# Patient Record
Sex: Female | Born: 1985 | Race: White | Hispanic: No | Marital: Married | Smoking: Never smoker
Health system: Southern US, Community
[De-identification: ages and names within clinical notes are randomized; demographics above are authoritative.]

## PROBLEM LIST (undated history)

## (undated) DIAGNOSIS — E282 Polycystic ovarian syndrome: Secondary | ICD-10-CM

## (undated) HISTORY — DX: Polycystic ovarian syndrome: E28.2

---
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
## 2006-08-03 ENCOUNTER — Encounter: Payer: Self-pay | Admitting: Family Medicine

## 2006-08-13 ENCOUNTER — Encounter: Payer: Self-pay | Admitting: Family Medicine

## 2006-08-13 ENCOUNTER — Other Ambulatory Visit: Admission: RE | Admit: 2006-08-13 | Discharge: 2006-08-13 | Payer: Self-pay | Admitting: Family Medicine

## 2006-08-13 ENCOUNTER — Ambulatory Visit: Payer: Self-pay | Admitting: Family Medicine

## 2006-08-13 LAB — CONVERTED CEMR LAB: Pap Smear: NORMAL

## 2006-08-14 ENCOUNTER — Encounter: Payer: Self-pay | Admitting: Family Medicine

## 2006-08-16 ENCOUNTER — Encounter: Payer: Self-pay | Admitting: Family Medicine

## 2006-08-16 LAB — CONVERTED CEMR LAB
ALT: 27 units/L (ref 0–35)
AST: 24 units/L (ref 0–37)
Albumin: 4.4 g/dL (ref 3.5–5.2)
Alkaline Phosphatase: 48 units/L (ref 39–117)
BUN: 10 mg/dL (ref 6–23)
CO2: 26 meq/L (ref 19–32)
Calcium: 9.7 mg/dL (ref 8.4–10.5)
Chloride: 104 meq/L (ref 96–112)
Cholesterol, target level: 200 mg/dL
Cholesterol: 181 mg/dL (ref 0–200)
Creatinine, Ser: 0.76 mg/dL (ref 0.40–1.20)
Glucose, Bld: 85 mg/dL (ref 70–99)
HDL goal, serum: 40 mg/dL
HDL: 75 mg/dL (ref >39)
LDL Cholesterol: 96 mg/dL (ref 0–99)
LDL Goal: 160 mg/dL
Potassium: 4.4 meq/L (ref 3.5–5.3)
Sodium: 141 meq/L (ref 135–145)
Total Bilirubin: 0.9 mg/dL (ref 0.3–1.2)
Total CHOL/HDL Ratio: 2.4
Total Protein: 7.5 g/dL (ref 6.0–8.3)
Triglycerides: 49 mg/dL (ref <150)
VLDL: 10 mg/dL (ref 0–40)

## 2007-09-08 ENCOUNTER — Ambulatory Visit: Payer: Self-pay | Admitting: Family Medicine

## 2007-09-08 ENCOUNTER — Other Ambulatory Visit: Admission: RE | Admit: 2007-09-08 | Discharge: 2007-09-08 | Payer: Self-pay | Admitting: Family Medicine

## 2007-09-08 ENCOUNTER — Encounter: Payer: Self-pay | Admitting: Family Medicine

## 2007-09-08 ENCOUNTER — Telehealth: Payer: Self-pay | Admitting: Family Medicine

## 2007-09-08 DIAGNOSIS — R635 Abnormal weight gain: Secondary | ICD-10-CM | POA: Insufficient documentation

## 2007-09-08 DIAGNOSIS — L708 Other acne: Secondary | ICD-10-CM | POA: Insufficient documentation

## 2007-09-13 LAB — CONVERTED CEMR LAB
ALT: 25 units/L (ref 0–35)
AST: 18 units/L (ref 0–37)
Albumin: 4.6 g/dL (ref 3.5–5.2)
Alkaline Phosphatase: 55 units/L (ref 39–117)
BUN: 12 mg/dL (ref 6–23)
CO2: 26 meq/L (ref 19–32)
Calcium: 9.9 mg/dL (ref 8.4–10.5)
Chloride: 103 meq/L (ref 96–112)
Creatinine, Ser: 0.81 mg/dL (ref 0.40–1.20)
FSH: 5.1 milliintl units/mL
Glucose, Bld: 100 mg/dL — ABNORMAL HIGH (ref 70–99)
LH: 15.4 milliintl units/mL
Pap Smear: NORMAL
Potassium: 4 meq/L (ref 3.5–5.3)
Sodium: 140 meq/L (ref 135–145)
TSH: 2.61 microintl units/mL (ref 0.350–4.50)
Testosterone: 58.83 ng/dL (ref 10–70)
Total Bilirubin: 0.4 mg/dL (ref 0.3–1.2)
Total Protein: 7.5 g/dL (ref 6.0–8.3)

## 2007-09-16 ENCOUNTER — Telehealth: Payer: Self-pay | Admitting: Family Medicine

## 2007-09-20 ENCOUNTER — Telehealth: Payer: Self-pay | Admitting: Family Medicine

## 2007-09-22 ENCOUNTER — Encounter: Payer: Self-pay | Admitting: Family Medicine

## 2007-10-04 LAB — CONVERTED CEMR LAB
DHEA-SO4: 233 ug/dL (ref 35–430)
Glucose, Bld: 100 mg/dL — ABNORMAL HIGH (ref 70–99)
Prolactin: 13.8 ng/mL
Sex Hormone Binding: 87 nmol/L (ref 18–114)
Testosterone Free: 4 pg/mL (ref 0.6–6.8)
Testosterone-% Free: 0.9 % (ref 0.4–2.4)
Testosterone: 44.08 ng/dL (ref 10–70)

## 2007-10-28 ENCOUNTER — Telehealth: Payer: Self-pay | Admitting: Family Medicine

## 2007-11-29 ENCOUNTER — Telehealth: Payer: Self-pay | Admitting: Family Medicine

## 2008-04-13 ENCOUNTER — Ambulatory Visit: Payer: Self-pay | Admitting: Family Medicine

## 2008-09-25 ENCOUNTER — Ambulatory Visit: Payer: Self-pay | Admitting: Family Medicine

## 2008-10-24 ENCOUNTER — Ambulatory Visit: Payer: Self-pay | Admitting: Family Medicine

## 2009-08-19 ENCOUNTER — Encounter: Payer: Self-pay | Admitting: Family Medicine

## 2009-08-19 LAB — CONVERTED CEMR LAB: Pap Smear: NORMAL

## 2009-08-21 ENCOUNTER — Ambulatory Visit: Payer: Self-pay | Admitting: Obstetrics & Gynecology

## 2009-09-16 ENCOUNTER — Ambulatory Visit: Payer: Self-pay | Admitting: Family Medicine

## 2009-09-16 LAB — CONVERTED CEMR LAB
Bilirubin Urine: NEGATIVE
Glucose, Urine, Semiquant: NEGATIVE
Hemoglobin: 15 g/dL
Ketones, urine, test strip: NEGATIVE
Nitrite: NEGATIVE
Protein, U semiquant: NEGATIVE
Specific Gravity, Urine: 1.01
Urobilinogen, UA: 0.2
WBC Urine, dipstick: NEGATIVE
pH: 6.5

## 2010-02-18 NOTE — Miscellaneous (Signed)
Summary: Flu Shot/[facility]  Flu Shot/[facility]   Imported By: Lanelle Bal 11/08/2008 08:49:36  _____________________________________________________________________  External Attachment:    Type:   Image     Comment:   External Document

## 2010-02-18 NOTE — Letter (Signed)
Summary: Lipid Letter  [facility]  [address]   [place], [place] [address]   Phone: [phone]  Fax: [phone]    08/16/2006  Erin Stone [address] [place], [place]  [address]  Dear Erin Stone:  We have carefully reviewed your last lipid profile from 08/14/2006 and the results are noted below with a summary of recommendations for lipid management.    Cholesterol:       181     Goal: <200   HDL "good" Cholesterol:   75     Goal: >40   LDL "bad" Cholesterol:     96     Goal: <160   Triglycerides:       49     Goal: <150    Your lipid goals have been met.  Continue with your current TLC diet and regimen (see below). Your liver, kidneys, and blood salts look great!    TLC Diet (Therapeutic Lifestyle Change): Total Fat should be no greater than 25-35% of total calories Carbohydrates should be 50-60% of total calories Protein should be approximately 15% of total calories Fiber should be at least 20-30 grams a day ***Increased fiber may help lower LDL Total Cholesterol should be < 200mg /day Consider adding plant stanol/sterols to diet (example: Benacol spread) ***A higher intake of unsaturated fat may reduce Triglycerides and Increase HDL    Adjunctive Measures (may lower LIPIDS and reduce risk of Heart Attack) include: Aerobic Exercise (20-30 minutes 3-4 times a week) Limit Alcohol Consumption Dietary Fiber 20-30 grams a day by mouth     Current Medications:  None If you have any questions, please call. We appreciate being able to work with you.   Sincerely,    [facility] Nani Gasser MD

## 2010-02-18 NOTE — Progress Notes (Signed)
Summary: Change Acne med  Phone Note Call from Patient Call back at Home Phone [phone]   Caller: Patient Call For: Nani Gasser MD Summary of Call: Pt calls and states that [facility] and was wondering if there was anything cheaper you could [facility] [address]. KJ LPN Initial call taken by: Kathlene November,  September 08, 2007 4:30 PM    New/Updated Medications: CLINDAMYCIN PHOSPHATE 1 %  LOTN (CLINDAMYCIN PHOSPHATE) Apply two times a day to affected area. Generic please   Prescriptions: CLINDAMYCIN PHOSPHATE 1 %  LOTN (CLINDAMYCIN PHOSPHATE) Apply two times a day to affected area. Generic please  #74ml x 3   Entered and Authorized by:   Nani Gasser MD   Signed by:   Nani Gasser MD on 09/08/2007   Method used:   [facility] ...       [facility]  [id number]       [address] [place], [place]         Ph: [phone] or [phone]       Fax: [phone]   RxID:   [id number]

## 2010-02-18 NOTE — Progress Notes (Signed)
Summary: Pt request cell number to be called for results.  Phone Note Call from Patient Call back at [phone] cell   Caller: Patient Call For: FYI Details for Reason: Number to call for results Summary of Call: Pt would like to be called at the cell number listed above for any tests or alb results. KJ LPN Initial call taken by: Kathlene November,  September 16, 2007 12:07 PM

## 2010-02-18 NOTE — Assessment & Plan Note (Signed)
Summary: SORE THROAT & COUGH/KH   Vital Signs:  Patient Profile:   25 Years Old Female CC:      head congestion, throat and ear discomfort Height:     64.5 inches (163.83 cm) Weight:      169 pounds O2 Sat:      98 % O2 treatment:    Room Air Temp:     97.6 degrees F oral Pulse rate:   81 / minute Resp:     20 per minute BP sitting:   133 / 88  (left arm) Cuff size:   regular  Pt. in pain?   no                   Current Allergies: No known allergies History of Present Illness Chief Complaint: head congestion, throat and ear discomfort History of Present Illness: Subjective: Patient complains of URI symptoms that started 3 days ago. + sore throat + cough No pleuritic pain No wheezing + nasal congestion +  post-nasal drainage + sinus pain/pressure No itchy/red eyes No earache No hemoptysis No SOB No fever/chills No nausea No vomiting No abdominal pain No diarrhea No skin rashes + fatigue No myalgias No headache Used OTC meds without relief   REVIEW OF SYSTEMS Constitutional Symptoms      Denies fever, chills, night sweats, weight loss, weight gain, and fatigue.  Eyes       Denies change in vision, eye pain, eye discharge, glasses, contact lenses, and eye surgery. Ear/Nose/Throat/Mouth       Complains of ear pain, frequent nose bleeds, and sore throat.      Denies hearing loss/aids, change in hearing, ear discharge, dizziness, frequent runny nose, sinus problems, hoarseness, and tooth pain or bleeding.  Respiratory       Complains of dry cough.      Denies productive cough, wheezing, shortness of breath, asthma, bronchitis, and emphysema/COPD.  Cardiovascular       Denies murmurs, chest pain, and tires easily with exhertion.    Gastrointestinal       Denies stomach pain, nausea/vomiting, diarrhea, constipation, blood in bowel movements, and indigestion. Genitourniary       Denies painful urination, kidney stones, and loss of urinary  control. Neurological       Complains of headaches.      Denies paralysis, seizures, and fainting/blackouts. Musculoskeletal       Denies muscle pain, joint pain, joint stiffness, decreased range of motion, redness, swelling, muscle weakness, and gout.  Skin       Denies bruising, unusual mles/lumps or sores, and hair/skin or nail changes.  Psych       Denies mood changes, temper/anger issues, anxiety/stress, speech problems, depression, and sleep problems.  Past History:  Past Medical History:  Unremarkable    Objective:  Appearance:  Patient appears healthy, stated age, and in no acute distress  Eyes:  Pupils are equal, round, and reactive to light and accomdation.  Extraocular movement is intact.  Conjunctivae are not inflamed.  Ears:  Canals normal.   Right tympanic membrane normal.  Left tympanic membrane has clear effusion Nose:  Normal septum.  Normal turbinates, mildly congested.   Maxillary sinus tenderness present.  Pharynx:  Normal  Neck:  Supple.  No adenopathy is present.  No thyromegaly is present  Lungs:  Clear to auscultation.  Breath sounds are equal.  Heart:  Regular rate and rhythm without murmurs, rubs, or gallops.  Abdomen:  Nontender without masses or hepatosplenomegaly.  Bowel sounds are present.  No CVA or flank tenderness.  Skin:  no rash Assessment New Problems: OTITIS MEDIA, SEROUS, ACUTE, LEFT (ICD-381.01) ACUTE MAXILLARY SINUSITIS (ICD-461.0) URI (ICD-465.9)   Plan New Medications/Changes: TUSSICAPS 10-8 MG XR12H-CAP (HYDROCOD POLST-CHLORPHEN POLST) One by mouth hs as needed cough  #10 (ten) x 0, 09/25/2008, Donna Christen MD AMOXICILLIN 875 MG TABS (AMOXICILLIN) One by mouth two times a day  #20 x 0, 09/25/2008, Donna Christen MD  New Orders: New Patient Level III [phone] Planning Comments:   Begin amoxicillin, expectorant/decongestant daytime, cough suppressant at night Follow-up with PCP if not improving   The patient and/or caregiver has  been counseled thoroughly with regard to medications prescribed including dosage, schedule, interactions, rationale for use, and possible side effects and they verbalize understanding.  Diagnoses and expected course of recovery discussed and will return if not improved as expected or if the condition worsens. Patient and/or caregiver verbalized understanding.  Prescriptions: TUSSICAPS 10-8 MG XR12H-CAP (HYDROCOD POLST-CHLORPHEN POLST) One by mouth hs as needed cough  #10 (ten) x 0   Entered and Authorized by:   Donna Christen MD   Signed by:   Donna Christen MD on 09/25/2008   Method used:   Print then Give to Patient   RxID:   [id number] AMOXICILLIN 875 MG TABS (AMOXICILLIN) One by mouth two times a day  #20 x 0   Entered and Authorized by:   Donna Christen MD   Signed by:   Donna Christen MD on 09/25/2008   Method used:   Print then Give to Patient   RxID:   [id number]   Patient Instructions: 1)  May use Mucinex D (guaifenesin with decongestant) twice daily for congestion. 2)  Increase fluid intake, rest. 3)  Ibuprofen 200mg , 4 tabs every 8 hours with food for sore throat 4)  May use Afrin nasal spray (or generic oxymetazoline) twice daily for about 5 days.  Also recommend using saline nasal spray several times daily and/or saline nasal irrigation. 5)  Followup with family doctor if not improving one week.

## 2010-02-18 NOTE — Assessment & Plan Note (Signed)
Summary: CPE,PAP   Vital Signs:  Patient Profile:   25 Years Old Female LMP:     07/19/2006 Height:     64.5 inches (163.83 cm) Weight:      136 pounds Pulse rate:   79 / minute BP sitting:   129 / 78  (left arm) Cuff size:   regular  Vitals Entered By: Kathlene November (August 13, 2006 9:32 AM)  Menstrual History: LMP (date): 07/19/2006               PCP:  Cipriano Bunker  Chief Complaint:  CPE with pap.  History of Present Illness: Wants to start HPV vaccines.   Current Allergies: No known allergies   Past Surgical History:    Reviewed history from 10/27/2005 and no changes required:       ACL sx, left knee   Family History:    MI-GF  Social History:    Pension scheme manager at [employer].  Plays soccer. Does Cardio and weights for exercise.  1 sodas daily, no tob, no drugs, occ EtOH.  In fall will live in a dorm.     Risk Factors:  Tobacco use:  never Drug use:  no Caffeine use:  1 drinks per day Alcohol use:  yes Exercise:  yes    Times per week:  1  Family History Risk Factors:    Family History of MI in females < 56 years old:  no    Family History of MI in males < 1 years old:  no   Review of Systems  The patient denies fever, weight loss, vision loss, decreased hearing, chest pain, syncope, abdominal pain, muscle weakness, suspicious skin lesions, abnormal bleeding, and breast masses.     Physical Exam  General:     Well-developed,well-nourished,in no acute distress; alert,appropriate and cooperative throughout examination Head:     Normocephalic and atraumatic without obvious abnormalities. No apparent alopecia or balding. Eyes:     No corneal or conjunctival inflammation noted. EOMI. Perrla.  Ears:     External ear exam shows no significant lesions or deformities.  Otoscopic examination reveals clear canals, tympanic membranes are intact bilaterally without bulging, retraction, inflammation or discharge. Hearing is grossly normal bilaterally. Nose:  External nasal examination shows no deformity or inflammation.  Mouth:     Oral mucosa and oropharynx without lesions or exudates.  Teeth in good repair. Neck:     No deformities, masses, or tenderness noted. Lungs:     Normal respiratory effort, chest expands symmetrically. Lungs are clear to auscultation, no crackles or wheezes. Heart:     Normal rate and regular rhythm. S1 and S2 normal without gallop, murmur, click, rub or other extra sounds. Abdomen:     Bowel sounds positive,abdomen soft and non-tender without masses, organomegaly or hernias noted. Genitalia:     Normal introitus for age, no external lesions, no vaginal discharge, mucosa pink and moist, no vaginal or cervical lesions, no vaginal atrophy, no friaility or hemorrhage, normal uterus size and position, no adnexal masses or tenderness Msk:     normal ROM.   Pulses:     R and L carotid,radial,dorsalis pedis and posterior tibial pulses are full and equal bilaterally Extremities:     No LE edema.  Neurologic:     No cranial nerve deficits noted. Station and gait are normal. . Sensory, motor and coordinative functions appear intact. Skin:     no rashes.   Cervical Nodes:     No lymphadenopathy noted Psych:  Cognition and judgment appear intact. Alert and cooperative with normal attention span and concentration. No apparent delusions, illusions, hallucinations    Impression & Recommendations:  Problem # 1:  Gynecological examination-routine (ICD-V72.31) [place] calcium + Vitamin D.  Encourage regular exercise Gardasil started today. Screening labs.  Menses are regular.  Pt not interested in birth control at this time.   Other Orders: T-Comprehensive Metabolic Panel [id number]) T-Lipid Profile [phone]) HPV Vaccine - 3 sched doses - IM ([id number]) Admin 1st Vaccine (28413)   Patient Instructions: 1)  Take calcium +Vitamin D daily. 2)  It is important that you exercise regularly at least 20 minutes 5  times a week. If you develop chest pain, have severe difficulty breathing, or feel very tired , stop exercising immediately and seek medical attention.         HPV # 1    Vaccine Type: Gardasil    Site: right deltoid    Mfr: Merck    Dose: 0.5 ml    Route: IM    Given by: Kathlene November    Exp. Date: 11/09/2008    Lot #: [id number]    VIS given: 02/20/05 version given August 13, 2006.

## 2010-02-18 NOTE — Assessment & Plan Note (Signed)
Summary: CPE,PAP   Vital Signs:  Patient Profile:   25 Years Old Female LMP:     08/20/2007 Height:     64.5 inches (163.83 cm) Weight:      167 pounds Pulse rate:   80 / minute BP sitting:   133 / 79  (left arm) Cuff size:   regular  Vitals Entered By: Kathlene November (September 08, 2007 3:36 PM)  Menstrual History: LMP (date): 08/20/2007                 PCP:  Cipriano Bunker  Chief Complaint:  CPE with pap.  History of Present Illness: her weight has increased.  Still working out 2-3 x a week.  No change in eating patterns. Tried Alli and still no weight loss. No meds. No family hx of thyroid.  More acne ? not sure. Has been very stressed with work and school. Would like to start birth control to try to help with her acne. Has tried OTC medication with no relief.     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 Does Cardio and weights for exercise.  1 sodas daily, no tob, no drugs, occ EtOH.  In fall will live in a dorm.     Risk Factors:  HIV high-risk behavior:  no    Drinks per day:  0 Exercise:  yes    Type:  weights, cardio   Review of Systems  The patient denies anorexia, fever, weight loss, weight gain, vision loss, decreased hearing, hoarseness, chest pain, syncope, dyspnea on exertion, peripheral edema, prolonged cough, headaches, hemoptysis, abdominal pain, melena, hematochezia, severe indigestion/heartburn, hematuria, incontinence, genital sores, muscle weakness, suspicious skin lesions, transient blindness, difficulty walking, depression, unusual weight change, abnormal bleeding, enlarged lymph nodes, and breast masses.     Physical Exam  General:     Well-developed,well-nourished,in no acute distress; alert,appropriate and cooperative throughout examination Head:     Normocephalic and atraumatic without  obvious abnormalities. No apparent alopecia or balding. Eyes:     No corneal or conjunctival inflammation noted. EOMI. Perrla.  Ears:     External ear exam shows no significant lesions or deformities.  Otoscopic examination reveals clear canals, tympanic membranes are intact bilaterally without bulging, retraction, inflammation or discharge. Hearing is grossly normal bilaterally. Nose:     External nasal examination shows no deformity or inflammation.  Mouth:     Oral mucosa and oropharynx without lesions or exudates.  Teeth in good repair. Neck:     No deformities, masses, or tenderness noted. Chest Wall:     No deformities, masses, or tenderness noted. Breasts:     No mass, nodules, thickening, tenderness, bulging, retraction, inflamation, nipple discharge or skin changes noted.   Lungs:     Normal respiratory effort, chest expands symmetrically. Lungs are clear to auscultation, no crackles or wheezes. Heart:     Normal rate and regular rhythm. S1 and S2 normal without gallop, murmur, click, rub or other extra sounds. Abdomen:     Bowel sounds positive,abdomen soft and non-tender without masses, organomegaly or hernias noted. Rectal:     no external abnormalities.   Genitalia:     Normal introitus for age, no external lesions, no vaginal discharge, mucosa pink and moist, no vaginal or cervical lesions, no vaginal atrophy, no  friaility or hemorrhage, normal uterus size and position, no adnexal masses or tenderness Msk:     No deformity or scoliosis noted of thoracic or lumbar spine.   Pulses:     R and L carotid,radial,l,dorsalis pedis and posterior tibial pulses are full and equal bilaterally Extremities:     No clubbing, cyanosis, edema, or deformity noted with normal full range of motion of all joints.   Neurologic:     No cranial nerve deficits noted. Station and gait are normal.t. Sensory, motor and coordinative functions appear intact. Skin:     Acne mostly on the sides of  her face and neck. Non pustular. Some scabbing from her picking.  Cervical Nodes:     No lymphadenopathy noted Psych:     Cognition and judgment appear intact. Alert and cooperative with normal attention span and concentration. No apparent delusions, illusions, hallucinations    Impression & Recommendations:  Problem # 1:  GYNECOLOGICAL EXAMINATION, ROUTINE (ICD-V72.31) Exam is normal.  Gave samples of [name] to try.   Orders: T-Comprehensive Metabolic Panel [id number]) T-TSH [id number])   Problem # 2:  ACNE VULGARIS (ICD-706.1) Trial of differin for 3 months. If not helping FU.  Orders: T-FSH ([id number]) T-LH [id number]) T-Testosterone; Total [id number])  Her updated medication list for this problem includes:    Differin 0.1 % Crea (Adapalene) .Marland Kitchen... Apply at bedtime and wash off inthe morning. pea sized amount. apply gently and wash hands afterwards Discussed care of the skin and different treatment options.   Problem # 3:  WEIGHT GAIN, ABNORMAL (ICD-783.1) Consider PCOS vs thyroid d/o. Her weight gain has been very abnormal.  Orders: T-Comprehensive Metabolic Panel [id number]) T-TSH [id number]) T-FSH [id number]) T-LH [id number]) T-Testosterone; Total [id number])   Complete Medication List: 1)  [name] 3-0.02 Mg Tabs (Drospirenone-ethinyl estradiol) .... Take 1 tablet by mouth once a day 2)  Differin 0.1 % Crea (Adapalene) .... Apply at bedtime and wash off inthe morning. pea sized amount. apply gently and wash hands afterwards   Patient Instructions: 1)  Take calcium +Vitamin D daily.   Prescriptions: DIFFERIN 0.1 %  CREA (ADAPALENE) Apply at bedtime and wash off inthe morning. Pea sized amount. Apply gently and wash hands afterwards  #1 tube x 3   Entered and Authorized by:   Nani Gasser MD   Signed by:   Nani Gasser MD on 09/08/2007   Method used:   [facility]       [facility]  [address]       [address] [place], [place]  [address]       Ph: [phone] or [phone]       Fax: [phone]   RxID:   [id number]  ]

## 2010-02-18 NOTE — Progress Notes (Signed)
Summary: [name] too ezxpensive  Phone Note Call from Patient Call back at [phone]   Caller: Patient Call For: Erin Stone Summary of Call: Pt can not afford the BCP it was 60.00. Is there anything else you can call in for her that is generic Initial call taken by: Kathlene November,  November 29, 2007 8:16 AM  Follow-up for Phone Call        Sent a generic. Try for a couple of months if not happy then call us.  Follow-up by: Erin Stone,  November 29, 2007 8:22 AM  Additional Follow-up for Phone Call Additional follow up Details #1::        Pt notified that rx sent to pharmacy Additional Follow-up by: Kathlene November,  November 29, 2007 8:29 AM    New/Updated Medications: NORGESTREL-ETHINYL ESTRADIOL 0.3-30 MG-MCG TABS (NORGESTREL-ETHINYL ESTRADIOL) Take 1 tablet by mouth once a day   Prescriptions: NORGESTREL-ETHINYL ESTRADIOL 0.3-30 MG-MCG TABS (NORGESTREL-ETHINYL ESTRADIOL) Take 1 tablet by mouth once a day  #1 pack x 11   Entered and Authorized by:   Erin Stone   Signed by:   Erin Stone on 11/29/2007   Method used:   [facility]ronically to        [facility] [phone]* (retail)       [address] [place], [place]  [address]       Ph: [phone] or [phone]       Fax: [phone]   RxID:   [id number]

## 2010-02-18 NOTE — Progress Notes (Signed)
Summary: [place]  Phone Note Call from Patient Call back at Home Phone [phone] Call back at [phone]   Caller: Patient Call For: Nani Gasser MD Summary of Call: Given samples of [name] and [facility] [address] [facility] [place] Initial call taken by: Kathlene November,  October 28, 2007 8:01 AM      Prescriptions: [name] 3-0.02 MG  TABS (DROSPIRENONE-ETHINYL ESTRADIOL) Take 1 tablet by mouth once a day  #1 x 11   Entered and Authorized by:   Nani Gasser MD   Signed by:   Nani Gasser MD on 10/28/2007   Method used:   [facility] to        [facility] [phone]* (retail)       [address] [place], [place]  [address]       Ph: [phone] or [phone]       Fax: [phone]   RxID:   [id number]

## 2010-02-18 NOTE — Letter (Signed)
Summary: Handout Printed  Printed Handout:  - Rheumatic Fever 

## 2010-02-18 NOTE — Assessment & Plan Note (Signed)
Summary: CPE, NO PAP   Vital Signs:  Patient profile:   25 year old female Height:      64.5 inches Weight:      169 pounds BMI:     28.66 O2 Sat:      96 % on Room air Temp:     97.2 degrees F oral Pulse rate:   66 / minute BP sitting:   138 / 77  (left arm) Cuff size:   regular  Vitals Entered By: Payton Spark CMA (October 24, 2008 1:38 PM)  O2 Flow:  Room air CC: CPE w/out pap   Primary Care Provider:  Linford Arnold, C  CC:  CPE w/out pap.  History of Present Illness: Is sexually active. Has regular periods. Really like her OCPs but has a hard time remembering to take them.   Current Medications (verified): 1)  Low-Ogestrel 0.3-30 Mg-Mcg Tabs (Norgestrel-Ethinyl Estradiol) .... Once A Day  Allergies (verified): No Known Drug Allergies  Comments:  Nurse/Medical Assistant: Payton Spark CMA (October 24, 2008 1:38 PM)  Past History:  Past Medical History: Last updated: 09/25/2008  Unremarkable  Past Surgical History: Last updated: 10/27/2005 ACL sx, left knee  Family History: Reviewed history from 08/13/2006 and no changes required. 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   Does Cardio and weights for exercise.  1 sodas daily, no tob, no drugs, occ EtOH.  In fall will live in a dorm.    Review of Systems  The patient denies anorexia, fever, weight loss, weight gain, vision loss, decreased hearing, hoarseness, chest pain, syncope, dyspnea on exertion, peripheral edema, prolonged cough, headaches, hemoptysis, abdominal pain, melena, hematochezia, severe indigestion/heartburn, hematuria, incontinence, genital sores, muscle weakness, suspicious skin lesions, transient blindness, difficulty walking, depression, unusual weight change, abnormal bleeding, enlarged lymph nodes, and breast masses.    Physical Exam  General:  Well-developed,well-nourished,in no acute distress; alert,appropriate and cooperative throughout examination Head:  Normocephalic and  atraumatic without obvious abnormalities. No apparent alopecia or balding. Eyes:  No corneal or conjunctival inflammation noted. EOMI. Perrla.  Ears:  External ear exam shows no significant lesions or deformities.  Otoscopic examination reveals clear canals, tympanic membranes are intact bilaterally without bulging, retraction, inflammation or discharge. Hearing is grossly normal bilaterally. Nose:  External nasal examination shows no deformity or inflammation. Mouth:  Oral mucosa and oropharynx without lesions or exudates.  Teeth in good repair. Neck:  No deformities, masses, or tenderness noted. Chest Wall:  No deformities, masses, or tenderness noted. Breasts:  No mass, nodules, thickening, tenderness, bulging, retraction, inflamation, nipple discharge or skin changes noted.   Lungs:  Normal respiratory effort, chest expands symmetrically. Lungs are clear to auscultation, no crackles or wheezes. Heart:  Normal rate and regular rhythm. S1 and S2 normal without gallop, murmur, click, rub or other extra sounds. Abdomen:  Bowel sounds positive,abdomen soft and non-tender without masses, organomegaly or hernias noted. Msk:  No deformity or scoliosis noted of thoracic or lumbar spine.   Pulses:  R and L carotid,radial,dorsalis pedis and posterior tibial pulses are full and equal bilaterally Neurologic:  No cranial nerve deficits noted. Station and gait are normal.DTRs are symmetrical throughout. Sensory, motor and coordinative functions appear intact. Skin:  no rashes.   Cervical Nodes:  No lymphadenopathy noted Psych:  Cognition and judgment appear intact. Alert and cooperative with normal attention span and concentration. No apparent delusions, illusions, hallucinations   Impression & Recommendations:  Problem # 1:  HEALTH MAINTENANCE EXAM (ICD-V70.0) Not sure  when last Tdap but says she will check.   Due for Gc/chalm since sexually active and under 25. Pap normal last year so recheck next  year. Due for lipid screen and recheck glucose.  Encourage 4 serv of dairy a day or a calcium supplement.   Recommend increaser her acitivity and exercise level. Se ahs maintained her weight. Discussed birth control options. Pt would like to try the patch. Rx sent to pharm. Call if any problems  Orders: T-Lipid Profile [phone]) T-Glucose, Blood [phone]) T-Chlamydia & GC Probe, Urine ([phone])  Complete Medication List: 1)  Low-ogestrel 0.3-30 Mg-mcg Tabs (Norgestrel-ethinyl estradiol) .... Once a day 2)  Ortho Evra 150-20 Mcg/24hr Ptwk (Norelgestromin-eth estradiol) .... Change once a week for 3 weeks. 4th week no patch. 3)  Metformin Hcl 500 Mg Tabs (Metformin hcl) .... Take 1 tablet by mouth once a day in the am  Other Orders: Admin 1st Vaccine (29562) Flu Vaccine 67yrs + (13086) Flu Vaccine Consent Questions     Do you have a history of severe allergic reactions to this vaccine? no    Any prior history of allergic reactions to egg and/or gelatin? no    Do you have a sensitivity to the preservative Thimersol? no    Do you have a past history of Guillan-Barre Syndrome? no    Do you currently have an acute febrile illness? no    Have you ever had a severe reaction to latex? no    Vaccine information given and explained to patient? yes    Are you currently pregnant? no    Lot Number:[id number]   Exp Date:07/18/2009   Site Given  Left Deltoid IMctions-CCC] Prescriptions: METFORMIN HCL 500 MG TABS (METFORMIN HCL) Take 1 tablet by mouth once a day in the AM  #90 x 3   Entered and Authorized by:   Nani Gasser MD   Signed by:   Nani Gasser MD on 10/24/2008   Method used:   [facility]ronically to        [facility] [phone]* (retail)       [address] [place], [place]  [address]       Ph: [phone] or [phone]       Fax: [phone]   RxID:   [id number] ORTHO EVRA 150-20 MCG/24HR PTWK (NORELGESTROMIN-ETH ESTRADIOL) Change once a week for 3  weeks. 4th week no patch.  #1 box x 11   Entered and Authorized by:   Nani Gasser MD   Signed by:   Nani Gasser MD on 10/24/2008   Method used:   [facility] to        [facility] [phone]* (retail)       [address] [place], [place]  [address]       Ph: [phone] or [phone]       Fax: [phone]   RxID:   [id number]     .lbflu

## 2010-02-18 NOTE — Progress Notes (Signed)
  Phone Note Call from Patient   Caller: Patient Call For: Nani Gasser MD Summary of Call: Pt needs order for the additional lab work you want done on her. Will come tomorrow. I will fax if you will print. Initial call taken by: Kathlene November,  September 20, 2007 3:31 PM  New Problems: BLOOD CHEMISTRY, ABNORMAL (ICD-790.99)   New Problems: BLOOD CHEMISTRY, ABNORMAL (ICD-790.99)

## 2010-02-18 NOTE — Assessment & Plan Note (Signed)
Summary: TB SKIN TEST  Nurse Visit   Vital Signs:  Patient profile:   25 year old female Height:      64.5 inches Weight:      167 pounds BMI:     28.32  Vitals Entered By: Harlene Salts (April 13, 2008 3:36 PM)   History of Present Illness: TB SKIN TEST PLACEMENT    Allergies: No Known Drug Allergies    Immunizations Administered:  PPD Skin Test:    Vaccine Type: PPD    Site: left forearm    Mfr: Sanofi Pasteur    Dose: 0.1 ml    Route: ID    Given by: Harlene Salts    Exp. Date: 05/07/2008    Lot #: [id number]   Orders Added: 1)  TB Skin Test Maurine.Goes    ]  Appended Document: TB SKIN TEST    Nurse Visit     Allergies: No Known Drug Allergies    PPD Results    Date of reading: 04/16/2008    Results: < 5mm    Interpretation: negative     ]

## 2010-02-18 NOTE — Letter (Signed)
Summary: Physical Exam & Immunization Forms  Physical Exam & Immunization Forms   Imported By: Lanelle Bal 09/27/2009 12:25:12  _____________________________________________________________________  External Attachment:    Type:   Image     Comment:   External Document

## 2010-02-18 NOTE — Assessment & Plan Note (Signed)
Summary: Nurse visit to complete school form.   Nurse Visit   Vital Signs:  Patient profile:   25 year old female Height:      64 inches Weight:      167 pounds BMI:     28.77 Temp:     98.4 degrees F oral Pulse rate:   63 / minute Resp:     22 per minute BP sitting:   137 / 85  (left arm) Cuff size:   regular  Vitals Entered By: Kathlene November (September 16, 2009 11:36 AM)  CC:  nurse visit- school form.  CC: nurse visit- school form  Vision Screening:Left eye w/o correction: 20 / 20 Right Eye w/o correction: 20 / 20 Both eyes w/o correction:  20/ 20  Color vision testing: normal      Vision Entered By: Kathlene November (September 16, 2009 11:39 AM) 40db HL: Left  500 hz: 40db 1000 hz: 40db 2000 hz: 40db 4000 hz: 40db Right  500 hz: 40db 1000 hz: 40db 2000 hz: 40db 4000 hz: 40db    Allergies: No Known Drug Allergies Laboratory Results   Urine Tests  Date/Time Received: 09/16/2009 Date/Time Reported: 09/16/2009  Routine Urinalysis   Color: yellow Appearance: Clear Glucose: negative   (Normal Range: Negative) Bilirubin: negative   (Normal Range: Negative) Ketone: negative   (Normal Range: Negative) Spec. Gravity: 1.010   (Normal Range: 1.003-1.035) Blood: trace-lysed   (Normal Range: Negative) pH: 6.5   (Normal Range: 5.0-8.0) Protein: negative   (Normal Range: Negative) Urobilinogen: 0.2   (Normal Range: 0-1) Nitrite: negative   (Normal Range: Negative) Leukocyte Esterace: negative   (Normal Range: Negative)     Blood Tests   Date/Time Received: 09/16/2009 Date/Time Reported: 09/16/2009    CBC   HGB:  15.0 g/dL   (Normal Range: 45.4-09.8 in Males, 12.0-15.0 in Females)    Immunizations Administered:  PPD Skin Test:    Vaccine Type: PPD    Site: left forearm    Mfr: Sanofi Pasteur    Dose: 0.1 ml    Route: ID    Given by: Kathlene November    Exp. Date: 11/20/2010    Lot #: [id number]  Orders Added: 1)  Fingerstick [36416] 2)  Hgb [85018] 3)  UA  Dipstick w/o Micro (automated)  [81003] 4)  TB Skin Test [86580] 5)  Admin 1st Vaccine [90471] 6)  Est. Patient Level I [11914] 7)  Vision Screening [78295] 8)  Hearing Screening [92551]  Appended Document: Nurse visit to complete school form.    PPD Results    Date of reading: 09/18/2009    Results: < 5mm    Interpretation: negative

## 2010-03-07 ENCOUNTER — Encounter (INDEPENDENT_AMBULATORY_CARE_PROVIDER_SITE_OTHER): Payer: Commercial Managed Care - PPO | Admitting: Family Medicine

## 2010-03-07 ENCOUNTER — Encounter: Payer: Self-pay | Admitting: Family Medicine

## 2010-03-07 DIAGNOSIS — Z0000 Encounter for general adult medical examination without abnormal findings: Secondary | ICD-10-CM

## 2010-03-07 DIAGNOSIS — Z23 Encounter for immunization: Secondary | ICD-10-CM

## 2010-03-09 LAB — CONVERTED CEMR LAB
ALT: 16 U/L
AST: 15 U/L
Albumin: 4.5 g/dL
Alkaline Phosphatase: 63 U/L
BUN: 12 mg/dL
CO2: 26 meq/L
Calcium: 9.5 mg/dL
Chloride: 104 meq/L
Cholesterol: 187 mg/dL
Creatinine, Ser: 0.73 mg/dL
Glucose, Bld: 91 mg/dL
HDL: 52 mg/dL
LDL Cholesterol: 122 mg/dL — ABNORMAL HIGH
Potassium: 4.5 meq/L
Sodium: 140 meq/L
Total Bilirubin: 0.5 mg/dL
Total CHOL/HDL Ratio: 3.6
Total Protein: 7.3 g/dL
Triglycerides: 66 mg/dL
VLDL: 13 mg/dL

## 2010-03-12 NOTE — Assessment & Plan Note (Signed)
Summary: CPE   Vital Signs:  Patient profile:   25 year old female Height:      64 inches Weight:      168 pounds Pulse rate:   80 / minute BP sitting:   150 / 93  (right arm) Cuff size:   regular  Vitals Entered By: Avon Gully CMA, Duncan Dull) (March 07, 2010 8:57 AM)  Serial Vital Signs/Assessments:  Time      Position  BP       Pulse  Resp  Temp     By 9:58 AM             123/88                         Avon Gully CMA, (AAMA)  CC: CPE,no pap   Primary Care Provider:  Linford Arnold, C  CC:  CPE and no pap.  History of Present Illness: She is off her OCPs for 4 months. Had a period that last almost 2 weeks but the second weeks with very slight spotting and was dark blood. Wants to make sure everything is ok. No pelvic cramping. Naormal Pap 6 months ago.    Current Medications (verified): 1)  None  Allergies (verified): No Known Drug Allergies  Comments:  Nurse/Medical Assistant: The patient's medications and allergies were reviewed with the patient and were updated in the Medication and Allergy Lists. 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  Does Cardio and weights for exercise.  No tob, no drugs, occ EtOH.  Married to [place].    Review of Systems  The patient denies anorexia, fever, weight loss, weight gain, vision loss, decreased hearing, hoarseness, chest pain, syncope, dyspnea on exertion, peripheral edema, prolonged cough, headaches, hemoptysis, abdominal pain, melena, hematochezia, severe indigestion/heartburn, hematuria, incontinence, genital sores, muscle weakness, suspicious skin lesions, transient blindness, difficulty walking, depression, unusual weight change, abnormal bleeding, enlarged lymph  nodes, and breast masses.    Physical Exam  General:  Well-developed,well-nourished,in no acute distress; alert,appropriate and cooperative throughout examination Head:  Normocephalic and atraumatic without obvious abnormalities. No apparent alopecia or balding. Eyes:  No corneal or conjunctival inflammation noted. EOMI. Perrla.  Ears:  External ear exam shows no significant lesions or deformities.  Otoscopic examination reveals clear canals, tympanic membranes are intact bilaterally without bulging, retraction, inflammation or discharge. Hearing is grossly normal bilaterally. Nose:  External nasal examination shows no deformity or inflammation.  Mouth:  Oral mucosa and oropharynx without lesions or exudates.  Teeth in good repair. Neck:  No deformities, masses, or tenderness noted. Lungs:  Normal respiratory effort, chest expands symmetrically. Lungs are clear to auscultation, no crackles or wheezes. Heart:  Normal rate and regular rhythm. S1 and S2 normal without gallop, murmur, click, rub or other extra sounds. Abdomen:  Bowel sounds positive,abdomen soft and non-tender without masses, organomegaly or hernias noted. Msk:  No deformity or scoliosis noted of thoracic or lumbar spine.   Pulses:  R and L carotid,radial,femoral,dorsalis pedis and posterior tibial pulses are full and equal bilaterally Extremities:  No clubbing, cyanosis, edema, or deformity noted with normal full range of motion of all joints.   Neurologic:  No cranial nerve deficits noted. Station and gait are normal. DTRs are symmetrical throughout.  Sensory, motor and coordinative functions appear intact. Skin:  no rashes.   Cervical Nodes:  No lymphadenopathy noted Psych:  Cognition and judgment appear intact. Alert and cooperative with normal attention span and concentration. No apparent delusions, illusions, hallucinations   Impression & Recommendations:  Problem # 1:  HEALTH MAINTENANCE EXAM (ICD-V70.0) Exam is normal  Keep up the exercise Start folic acid since might get pregnant off of her OCPs  Can restart metformin for her PCOS.   Screening labs.  Vaccines up dated today.  Orders: T-Comprehensive Metabolic Panel [phone]) T-Lipid Profile [phone])  Complete Medication List: 1)  Metformin Hcl 500 Mg Tabs (Metformin hcl) .... Take 1 tablet by mouth once a day in the am  Other Orders: Tdap => 62yrs IM (29562) Admin 1st Vaccine (13086)  Patient Instructions: 1)  Folic acid once day  0.6mg  since off of birth control or if you take a multivitamin you can switch to a prenatal vitamin which has the folic acid in it.  2)  Keep up the exercise 3)  We will call you with your lab results.  Prescriptions: METFORMIN HCL 500 MG TABS (METFORMIN HCL) Take 1 tablet by mouth once a day in the AM  #90 x 3   Entered and Authorized by:   Nani Gasser MD   Signed by:   Nani Gasser MD on 03/07/2010   Method used:   [facility]ronically to        [facility] [phone]* (retail)       [address] [place], [place]  [address]       Ph: [phone] or [phone]       Fax: [phone]   RxID:   [id number]    Orders Added: 1)  T-Comprehensive Metabolic Panel [[id number]] 2)  T-Lipid Profile [phone] 3)  Est. Patient age 95-39 [31] 4)  Tdap => 28yrs IM [90715] 5)  Admin 1st Vaccine [90471]   Immunization History:  Influenza Immunization History:    Influenza:  historical (10/19/2009)  Immunizations Administered:  Tetanus Vaccine:    Vaccine Type: Tdap    Site: right deltoid    Mfr: GlaxoSmithKline    Dose: 0.5 ml    Route: IM    Given by: Sue Lush McCrimmon CMA, (AAMA)    Exp. Date: 11/08/2011    Lot #: [id number]    VIS given: 12/07/07 version given March 07, 2010.   Immunization History:  Influenza Immunization History:    Influenza:  Historical (10/19/2009)  Immunizations Administered:  Tetanus Vaccine:    Vaccine Type: Tdap    Site: right deltoid     Mfr: GlaxoSmithKline    Dose: 0.5 ml    Route: IM    Given by: Sue Lush McCrimmon CMA, (AAMA)    Exp. Date: 11/08/2011    Lot #: [id number]    VIS given: 12/07/07 version given March 07, 2010.    Last PAP:  Normal (09/13/2007 5:00:07 PM) PAP Result Date:  08/19/2009 PAP Result:  normal PAP Next Due:  1 yr

## 2010-06-03 ENCOUNTER — Ambulatory Visit (INDEPENDENT_AMBULATORY_CARE_PROVIDER_SITE_OTHER): Payer: Commercial Managed Care - PPO | Admitting: Obstetrics & Gynecology

## 2010-06-03 DIAGNOSIS — E282 Polycystic ovarian syndrome: Secondary | ICD-10-CM

## 2010-06-03 NOTE — Assessment & Plan Note (Signed)
NAMEJALONI, SORBER               ACCOUNT NO.:  [id number]   MEDICAL RECORD NO.:  [id number]          PATIENT TYPE:  POB   LOCATION:  [facility] [place]         FACILITY:  [facility]   PHYSICIAN:  Allie Bossier, MD        DATE OF BIRTH:  1985/12/22   DATE OF SERVICE:  08/21/2009                                  CLINIC NOTE   [name] is a 25 year old engaged white G0, P0 who is here for  annual exam.  She has no GYN complaints.  She does wish to restart birth  control pills.   REVIEW OF SYSTEMS:  Her wedding is scheduled for September 2011.  She  has been monogamous for the last 3 years and reports that her periods  are monthly.  She is from [facility] and came and  researched this department.  Her last Pap smear was in 2009 and she has  never had an abnormal Pap smear.   PAST SURGICAL HISTORY:  She had an ACL repair in 2002.   PAST MEDICAL HISTORY:  She has a diagnosis of PCOS.   MEDICATIONS:  Metformin 500 mg daily.   ALLERGIES:  No known drug allergies.  No latex allergies.   FAMILY HISTORY:  Negative for breast, GYN and colon malignancies.   SOCIAL HISTORY:  She reports rare alcohol and denies tobacco use.   PHYSICAL EXAMINATION:  VITAL SIGNS:  Height 5 feet 4 inches, weight 167,  blood pressure 145/84, pulse 81.  HEENT:  Normal.  HEART:  Regular rate and rhythm.  LUNGS:  Clear to auscultation bilaterally.  BREASTS:  Normal bilaterally.  ABDOMEN:  Benign.  No palpable hepatosplenomegaly.  PELVIC:  External genitalia; shaved, no lesions.  Cervix; nulliparous,  normal discharge.  Uterus; normal size and shape, midplane, mobile.  Adnexa; nontender, no masses.   ASSESSMENT AND PLAN:  Annual exam.  I have checked Pap smear with  cervical cultures and recommended self-breast and self-vulvar exams  monthly.  I have given her a prescription for Loestrin 24 and samples  for 2 months.  She will call if this birth control pill does not suit  her or if she cannot  remember to take it.      Allie Bossier, MD     MCD/MEDQ  D:  08/21/2009  T:  08/21/2009  Job:  [id number]

## 2010-06-04 NOTE — Assessment & Plan Note (Signed)
NAME:  Erin Stone, Erin Stone NO.:  [id number]  MEDICAL RECORD NO.:  [id number]           PATIENT TYPE:  LOCATION:  [facility] [place]           FACILITY:  PHYSICIAN:  Allie Bossier, MD             DATE OF BIRTH:  DATE OF SERVICE:  06/03/2010                                 CLINIC NOTE  [name] is a 25 year old married white G0 who is here because she has missed the last 3 months of her period and she wishes to achieve pregnancy.  She quit using the birth control pills several months ago, so that she and her husband could desire pregnancy.  She has been on multivitamins to help decrease her risk of birth defects.  However, she has not had a periods since March 04, 2010.  Please note that she has a diagnosis of PCOS and had regular periods on the pill but without a pill, she does tend to skip periods.  I have recommended that we check TSH and FSH today.  Please note her pregnancy test is negative.  In the meantime, I am going to go ahead and give her Provera 10 mg a day for 10 days to start and Clomid, I have discussed the 5% risk of multiples with Clomid.  She will come back in 3-1/2 months.     Allie Bossier, MD    MCD/MEDQ  D:  06/03/2010  T:  06/04/2010  Job:  [id number]

## 2010-09-17 ENCOUNTER — Encounter: Payer: Self-pay | Admitting: Obstetrics & Gynecology

## 2010-09-17 ENCOUNTER — Ambulatory Visit (INDEPENDENT_AMBULATORY_CARE_PROVIDER_SITE_OTHER): Payer: Commercial Managed Care - PPO | Admitting: Obstetrics & Gynecology

## 2010-09-17 ENCOUNTER — Encounter: Payer: Self-pay | Admitting: *Deleted

## 2010-09-17 VITALS — BP 143/90 | HR 106 | Temp 97.3°F | Ht 64.0 in | Wt 173.0 lb

## 2010-09-17 DIAGNOSIS — E282 Polycystic ovarian syndrome: Secondary | ICD-10-CM

## 2010-09-17 LAB — GLUCOSE, POCT (MANUAL RESULT ENTRY): POC Glucose: 86

## 2010-09-17 NOTE — Progress Notes (Signed)
  Subjective:    Patient ID: Erin Stone, female    DOB: November 15, 1985, 25 y.o.   MRN: [id number]  HPI  She and her husband are very happy because they had a positive pregnancy test last Wed.  She used 3 cycles of clomid.   Review of Systems     Objective:   Physical Exam   TVS reveals questionable twins     Assessment & Plan:  Early pregnancy- schedule NOB visit with [place] here in a few weeks.

## 2010-09-30 ENCOUNTER — Other Ambulatory Visit: Payer: Self-pay | Admitting: Obstetrics & Gynecology

## 2010-09-30 ENCOUNTER — Ambulatory Visit (INDEPENDENT_AMBULATORY_CARE_PROVIDER_SITE_OTHER): Payer: Commercial Managed Care - PPO | Admitting: Obstetrics & Gynecology

## 2010-09-30 VITALS — BP 112/72 | Temp 98.6°F | Wt 173.0 lb

## 2010-09-30 DIAGNOSIS — Z3480 Encounter for supervision of other normal pregnancy, unspecified trimester: Secondary | ICD-10-CM

## 2010-09-30 NOTE — Progress Notes (Signed)
NOB intake with bedside u/s showing gestational age of [redacted]w[redacted]d CRL 5.37mm and FHR of 122bpm IUP.  Prenatal labs drawn including Cystic fibrosis screening.  Pt is currently taking PNV and has no c/o's except mild occasional nausea.  This was a planned pregnancy on Clomid.  Pt is scheduled on 10/13/10 with CNM for prenatal exam.   All questions answered and pt will called if any problems or concerns.

## 2010-10-01 LAB — OBSTETRIC PANEL
Antibody Screen: NEGATIVE
Basophils Absolute: 0 10*3/uL (ref 0.0–0.1)
Basophils Relative: 0 % (ref 0–1)
Eosinophils Absolute: 0.1 10*3/uL (ref 0.0–0.7)
Eosinophils Relative: 1 % (ref 0–5)
HCT: 44.6 % (ref 36.0–46.0)
Hemoglobin: 14 g/dL (ref 12.0–15.0)
Hepatitis B Surface Ag: NEGATIVE
Lymphocytes Relative: 27 % (ref 12–46)
Lymphs Abs: 1.9 10*3/uL (ref 0.7–4.0)
MCH: 30.1 pg (ref 26.0–34.0)
MCHC: 31.4 g/dL (ref 30.0–36.0)
MCV: 95.9 fL (ref 78.0–100.0)
Monocytes Absolute: 0.5 10*3/uL (ref 0.1–1.0)
Monocytes Relative: 7 % (ref 3–12)
Neutro Abs: 4.5 10*3/uL (ref 1.7–7.7)
Neutrophils Relative %: 65 % (ref 43–77)
Platelets: 262 10*3/uL (ref 150–400)
RBC: 4.65 MIL/uL (ref 3.87–5.11)
RDW: 14.4 % (ref 11.5–15.5)
Rh Type: POSITIVE
Rubella: 6.9 IU/mL — ABNORMAL HIGH
WBC: 6.9 10*3/uL (ref 4.0–10.5)

## 2010-10-01 LAB — HIV ANTIBODY (ROUTINE TESTING W REFLEX): HIV: NONREACTIVE

## 2010-10-02 LAB — CULTURE, URINE COMPREHENSIVE
Colony Count: NO GROWTH
Organism ID, Bacteria: NO GROWTH

## 2010-10-02 LAB — GC/CHLAMYDIA PROBE AMP, URINE
Chlamydia, Swab/Urine, PCR: NEGATIVE
GC Probe Amp, Urine: NEGATIVE

## 2010-10-06 LAB — CYSTIC FIBROSIS DIAGNOSTIC STUDY: Date of Birth: [birth year]

## 2010-10-13 ENCOUNTER — Ambulatory Visit (INDEPENDENT_AMBULATORY_CARE_PROVIDER_SITE_OTHER): Payer: Commercial Managed Care - PPO | Admitting: Physician Assistant

## 2010-10-13 VITALS — BP 118/73 | Temp 98.3°F | Wt 173.0 lb

## 2010-10-13 DIAGNOSIS — Z3490 Encounter for supervision of normal pregnancy, unspecified, unspecified trimester: Secondary | ICD-10-CM

## 2010-10-13 DIAGNOSIS — Z3480 Encounter for supervision of other normal pregnancy, unspecified trimester: Secondary | ICD-10-CM

## 2010-10-13 NOTE — Progress Notes (Signed)
p-69 No c/o's. Today is couple's 1st wedding anniversary.

## 2010-10-13 NOTE — Patient Instructions (Signed)
Pregnancy - First Trimester During sexual intercourse, millions of sperm go into the vagina. Only 1 sperm will penetrate and fertilize the female egg while it is in the Fallopian tube. One week later, the fertilized egg implants into the wall of the uterus. An embryo begins to develop into a baby. At 6 to 8 weeks, the eyes and face are formed and the heartbeat can be seen on ultrasound. At the end of 12 weeks (first trimester), all the baby's organs are formed. Now that you are pregnant, you will want to do everything you can to have a healthy baby. Two of the most important things are to get good prenatal care and follow your caregiver's instructions. Prenatal care is all the medical care you receive before the baby's birth. It is given to prevent, find and treat problems during the pregnancy and childbirth. PRENATAL EXAMS:  During prenatal visits, your weight, blood pressure and urine are checked. This is done to make sure you are healthy and progressing normally during the pregnancy.   A pregnant woman should gain 25 to 35 pounds during the pregnancy. However, if you are over weight or underweight, your caregiver will advise you regarding your weight.   Your caregiver will ask and answer questions for you.   Blood work, cervical cultures, other necessary tests and a Pap test are done during your prenatal exams. These tests are done to check on your health and the probable health of your baby. Tests are strongly recommended and done for HIV with your permission. This is the virus that causes AIDS. These tests are done because medications can be given to help prevent your baby from being born with this infection should you have been infected without knowing it. Blood work is also used to find out your blood type, previous infections and follow your blood levels (hemoglobin).   Low hemoglobin (anemia) is common during pregnancy. Iron and vitamins are given to help prevent this. Later in the pregnancy,  blood tests for diabetes will be done along with any other tests if any problems develop. You may need tests to make sure you and the baby are doing well.   You may need other tests to make sure you and the baby are doing well.  CHANGES DURING THE FIRST TRIMESTER (THE FIRST 3 MONTHS OF PREGNANCY) Your body goes through many changes during pregnancy. They vary from person to person. Talk to your caregiver about changes you notice and are concerned about. Changes can include:  Your menstrual period stops.   The egg and sperm carry the genes that determine what you look like. Genes from you and your partner are forming a baby. The female genes determine whether the baby is a boy or a girl.   Your body increases in girth and you may feel bloated.   Feeling sick to your stomach (nauseous) and throwing up (vomiting). If the vomiting is uncontrollable, call your caregiver.   Your breasts will begin to enlarge and become tender.   Your nipples may stick out more and become darker.   The need to urinate more. Painful urination may mean you have a bladder infection.   Tiring easily.   Loss of appetite.   Cravings for certain kinds of food.   At first, you may gain or lose a couple of pounds.   You may have changes in your emotions from day to day (excited to be pregnant or concerned something may go wrong with the pregnancy and baby).  You may have more vivid and strange dreams.  HOME CARE INSTRUCTIONS  It is very important to avoid all smoking, alcohol and un-prescribed drugs during your pregnancy. These affect the formation and growth of the baby. Avoid chemicals while pregnant to ensure the delivery of a healthy infant.   Start your prenatal visits by the 12th week of pregnancy. They are usually scheduled monthly at first, then more often in the last 2 months before delivery. Keep your caregiver's appointments. Follow your caregiver's instructions regarding medication use, blood and lab  tests, exercise, and diet.   During pregnancy, you are providing food for you and your baby. Eat regular, well-balanced meals. Choose foods such as meat, fish, milk and other low fat dairy products, vegetables, fruits, and whole-grain breads and cereals. Your caregiver will tell you of the ideal weight gain.   You can help morning sickness by keeping soda crackers (saltines) at the bedside. Eat a couple before arising in the morning. You may want to use the crackers without salt on them.   Eating 4 to 5 small meals rather than 3 large meals a day also may help the nausea and vomiting.   Drinking liquids between meals instead of during meals also seems to help nausea and vomiting.   A physical sexual relationship may be continued throughout pregnancy if there are no other problems. Problems may be early (premature) leaking of amniotic fluid from the membranes, vaginal bleeding, or belly (abdominal) pain.   Exercise regularly if there are no restrictions. Check with your caregiver or physical therapist if you are unsure of the safety of some of your exercises. Greater weight gain will occur in the last 2 trimesters of pregnancy. Exercising will help:   Control your weight.   Keep you in shape.   Prepare you for labor and delivery.   Help you lose your pregnancy weight after you deliver your baby.   Wear a good support or jogging bra for breast tenderness during pregnancy. This may help if worn during sleep too.   Ask when prenatal classes are available. Begin classes when they are offered.   Do not use hot tubs, steam rooms or saunas.   Wear your seat belt when driving. This protects you and your baby if you are in an accident.   Avoid raw meat, uncooked cheese, cat litter boxes and soil used by cats throughout the pregnancy. These carry germs that can cause birth defects in the baby.   The first trimester is a good time to visit your dentist for your dental health. Getting your teeth  cleaned is OK. Use a softer toothbrush and brush gently during pregnancy.   Ask for help if you have financial, counseling or nutritional needs during pregnancy. Your caregiver will be able to offer counseling for these needs as well as refer you for other special needs.   Do not take any medications or herbs unless told by your caregiver.   Inform your caregiver if there is any mental or physical domestic violence.   Make a list of emergency phone numbers of family, friends, hospital, police and fire department.   Write down your questions. Take them to your prenatal visit.   Do not douche.   Do not cross your legs.   If you have to stand for long periods of time, rotate you feet or take small steps in a circle.   You may have more vaginal secretions that may require a sanitary pad. Do not use tampons   or scented sanitary pads.  MEDICATIONS AND DRUG USE IN PREGNANCY  Take prenatal vitamins as directed. The vitamin should contain 1 milligram of folic acid. Keep all vitamins out of reach of children. Only a couple vitamins or tablets containing iron may be fatal to a baby or young child when ingested.   Avoid use of all medications, including herbs, over-the-counter medications, not prescribed or suggested by your caregiver. Only take over-the-counter or prescription medicines for pain, discomfort, or fever as directed by your caregiver. Do not use aspirin, ibuprofen (Motrin, Advil, Nuprin) or naproxen (Aleve) unless OK'd by your caregiver.   Let your caregiver also know about herbs you may be using.   Alcohol is related to a number of birth defects. This includes fetal alcohol syndrome. All alcohol, in any form, should be avoided completely. Smoking will cause low birth rate and premature babies.   Street/illegal drugs are very harmful to the baby. They are absolutely forbidden. A baby born to an addicted mother will be addicted at birth. The baby will go through the same withdrawal  an adult does.   Let your caregiver know about any medications that you have to take and for what reason you take them.  MISCARRIAGE IS COMMON DURING PREGNANCY A miscarriage does not mean you did something wrong. It is not a reason to worry about getting pregnant again. Your caregiver will help you with questions you may have. If you have a miscarriage, you may need minor surgery (a D & C). SEEK MEDICAL CARE IF:  You have any concerns or worries during your pregnancy. It is better to call with your questions if you feel they cannot wait, rather than worry about them.  SEEK IMMEDIATE MEDICAL CARE IF:  An unexplained oral temperature above 100.5 develops, or as your caregiver suggests.   You have leaking of fluid from the vagina (birth canal). If leaking membranes are suspected, take your temperature and inform your caregiver of this when you call.   There is vaginal spotting or bleeding. Notify your caregiver of the amount and how many pads are used.   You develop a bad smelling vaginal discharge with a change in the color.   You continue to feel sick to your stomach (nauseated) and have no relief from remedies suggested. You vomit blood or coffee ground like materials.   You lose more than 2 pounds of weight in one week.   You gain more than 2 pounds of weight in a week and you notice swelling of your face, hands, feet or legs.   You gain 5 pounds or more in 1 week (even if you do not have swelling of your hands, face, legs or feet).   You get exposed to [place] measles and have never had them.   You are exposed to fifth disease or chicken pox.   You develop belly (abdominal) pain. Round ligament discomfort is a common non-cancerous (benign) cause of abdominal pain in pregnancy. Your caregiver still must evaluate this.   You develop headache, fever, diarrhea, pain with urination, or shortness of breath.   You fall, are in a car accident or have any kind of trauma.   There is mental  or physical violence in your home.  Document Released: 12/30/2000 Document Re-Released: 06/25/2009 [facility] Patient Information 2011 [place], [place].

## 2010-10-13 NOTE — Progress Notes (Signed)
   Subjective:    Erin Stone is a G1P0000 [redacted]w[redacted]d being seen today for her first obstetrical visit.  Her obstetrical history is significant for None.  Pregnancy history fully reviewed. Conceived on provera/clomid secondary to PCOS.  Patient reports no complaints.  Filed Vitals:   10/13/10 1306  BP: 118/73  Temp: 98.3 F (36.8 C)  Weight: 173 lb (78.472 kg)    HISTORY: OB History    Grav Para Term Preterm Abortions TAB SAB Ect Mult Living   1 0 0 0 0 0 0 0 0 0      # Outc Date GA Lbr Len/2nd Wgt Sex Del Anes PTL Lv   1 CUR              Past Medical History  Diagnosis Date  . PCOS (polycystic ovarian syndrome)    Past Surgical History  Procedure Date  . Anterior cruciate ligament repair 2002   No family history on file.   Exam    Uterine Size: 9 cm  Pelvic Exam:    Perineum: Normal Perineum   Vulva: normal   Vagina:  normal mucosa       Cervix: nulliparous appearance   Adnexa: normal adnexa   Bony Pelvis: gynecoid  System: Breast:  No nipple retraction or dimpling, No nipple discharge or bleeding   Skin: normal coloration and turgor, no rashes Facial acne   Neurologic: oriented, normal, normal mood, gait normal; reflexes normal and symmetric   Extremities: normal strength, tone, and muscle mass   HEENT thyroid without masses   Mouth/Teeth mucous membranes moist, pharynx normal without lesions   Neck supple and no masses   Cardiovascular: regular rate and rhythm, no murmurs or gallops   Respiratory:  appears well, vitals normal, no respiratory distress, acyanotic, normal RR, ear and throat exam is normal, neck free of mass or lymphadenopathy, chest clear, no wheezing, crepitations, rhonchi, normal symmetric air entry   Abdomen: soft, non-tender; bowel sounds normal; no masses,  no organomegaly          Assessment:    Pregnancy: G1P0000 @ [redacted]w[redacted]d       Plan:     Initial labs drawn. Prenatal vitamins. Problem list reviewed and updated. Genetic Screening  discussed Integrated Screen: ordered.  Ultrasound discussed; fetal survey: will order at 18 weeks.  Follow up in 5 weeks.   Nathanael Krist E. 10/13/2010

## 2010-10-14 LAB — GC/CHLAMYDIA PROBE AMP, URINE
Chlamydia, Swab/Urine, PCR: NEGATIVE
GC Probe Amp, Urine: NEGATIVE

## 2010-11-10 ENCOUNTER — Ambulatory Visit (INDEPENDENT_AMBULATORY_CARE_PROVIDER_SITE_OTHER): Payer: Commercial Managed Care - PPO | Admitting: Physician Assistant

## 2010-11-10 VITALS — BP 120/79 | Temp 98.4°F | Wt 174.0 lb

## 2010-11-10 DIAGNOSIS — Z23 Encounter for immunization: Secondary | ICD-10-CM

## 2010-11-10 DIAGNOSIS — Z3490 Encounter for supervision of normal pregnancy, unspecified, unspecified trimester: Secondary | ICD-10-CM | POA: Insufficient documentation

## 2010-11-10 DIAGNOSIS — Z3480 Encounter for supervision of other normal pregnancy, unspecified trimester: Secondary | ICD-10-CM

## 2010-11-10 NOTE — Patient Instructions (Signed)
Pregnancy - Second Trimester The second trimester of pregnancy (3 to 6 months) is a period of rapid growth for you and your baby. At the end of the sixth month, your baby is about 9 inches long and weighs 1 1/2 pounds. You will begin to feel the baby move between 18 and 20 weeks of the pregnancy. This is called quickening. Weight gain is faster. A clear fluid (colostrum) may leak out of your breasts. You may feel small contractions of the womb (uterus). This is known as false labor or Braxton-Hicks contractions. This is like a practice for labor when the baby is ready to be born. Usually, the problems with morning sickness have usually passed by the end of your first trimester. Some women develop small dark blotches (called cholasma, mask of pregnancy) on their face that usually goes away after the baby is born. Exposure to the sun makes the blotches worse. Acne may also develop in some pregnant women and pregnant women who have acne, may find that it goes away. PRENATAL EXAMS  Blood work may continue to be done during prenatal exams. These tests are done to check on your health and the probable health of your baby. Blood work is used to follow your blood levels (hemoglobin). Anemia (low hemoglobin) is common during pregnancy. Iron and vitamins are given to help prevent this. You will also be checked for diabetes between 24 and 28 weeks of the pregnancy. Some of the previous blood tests may be repeated.   The size of the uterus is measured during each visit. This is to make sure that the baby is continuing to grow properly according to the dates of the pregnancy.   Your blood pressure is checked every prenatal visit. This is to make sure you are not getting toxemia.   Your urine is checked to make sure you do not have an infection, diabetes or protein in the urine.   Your weight is checked often to make sure gains are happening at the suggested rate. This is to ensure that both you and your baby are  growing normally.   Sometimes, an ultrasound is performed to confirm the proper growth and development of the baby. This is a test which bounces harmless sound waves off the baby so your caregiver can more accurately determine due dates.  Sometimes, a specialized test is done on the amniotic fluid surrounding the baby. This test is called an amniocentesis. The amniotic fluid is obtained by sticking a needle into the belly (abdomen). This is done to check the chromosomes in instances where there is a concern about possible genetic problems with the baby. It is also sometimes done near the end of pregnancy if an early delivery is required. In this case, it is done to help make sure the baby's lungs are mature enough for the baby to live outside of the womb. CHANGES OCCURING IN THE SECOND TRIMESTER OF PREGNANCY Your body goes through many changes during pregnancy. They vary from person to person. Talk to your caregiver about changes you notice that you are concerned about.  During the second trimester, you will likely have an increase in your appetite. It is normal to have cravings for certain foods. This varies from person to person and pregnancy to pregnancy.   Your lower abdomen will begin to bulge.   You may have to urinate more often because the uterus and baby are pressing on your bladder. It is also common to get more bladder infections during pregnancy (  pain with urination). You can help this by drinking lots of fluids and emptying your bladder before and after intercourse.   You may begin to get stretch marks on your hips, abdomen, and breasts. These are normal changes in the body during pregnancy. There are no exercises or medications to take that prevent this change.   You may begin to develop swollen and bulging veins (varicose veins) in your legs. Wearing support hose, elevating your feet for 15 minutes, 3 to 4 times a day and limiting salt in your diet helps lessen the problem.    Heartburn may develop as the uterus grows and pushes up against the stomach. Antacids recommended by your caregiver helps with this problem. Also, eating smaller meals 4 to 5 times a day helps.   Constipation can be treated with a stool softener or adding bulk to your diet. Drinking lots of fluids, vegetables, fruits, and whole grains are helpful.   Exercising is also helpful. If you have been very active up until your pregnancy, most of these activities can be continued during your pregnancy. If you have been less active, it is helpful to start an exercise program such as walking.   Hemorrhoids (varicose veins in the rectum) may develop at the end of the second trimester. Warm sitz baths and hemorrhoid cream recommended by your caregiver helps hemorrhoid problems.   Backaches may develop during this time of your pregnancy. Avoid heavy lifting, wear low heal shoes and practice good posture to help with backache problems.   Some pregnant women develop tingling and numbness of their hand and fingers because of swelling and tightening of ligaments in the wrist (carpel tunnel syndrome). This goes away after the baby is born.   As your breasts enlarge, you may have to get a bigger bra. Get a comfortable, cotton, support bra. Do not get a nursing bra until the last month of the pregnancy if you will be nursing the baby.   You may get a dark line from your belly button to the pubic area called the linea nigra.   You may develop rosy cheeks because of increase blood flow to the face.   You may develop spider looking lines of the face, neck, arms and chest. These go away after the baby is born.  HOME CARE INSTRUCTIONS   It is extremely important to avoid all smoking, herbs, alcohol, and unprescribed drugs during your pregnancy. These chemicals affect the formation and growth of the baby. Avoid these chemicals throughout the pregnancy to ensure the delivery of a healthy infant.   Most of your home  care instructions are the same as suggested for the first trimester of your pregnancy. Keep your caregiver's appointments. Follow your caregiver's instructions regarding medication use, exercise and diet.   During pregnancy, you are providing food for you and your baby. Continue to eat regular, well-balanced meals. Choose foods such as meat, fish, milk and other low fat dairy products, vegetables, fruits, and whole-grain breads and cereals. Your caregiver will tell you of the ideal weight gain.   A physical sexual relationship may be continued up until near the end of pregnancy if there are no other problems. Problems could include early (premature) leaking of amniotic fluid from the membranes, vaginal bleeding, abdominal pain, or other medical or pregnancy problems.   Exercise regularly if there are no restrictions. Check with your caregiver if you are unsure of the safety of some of your exercises. The greatest weight gain will occur in the   last 2 trimesters of pregnancy. Exercise will help you:   Control your weight.   Get you in shape for labor and delivery.   Lose weight after you have the baby.   Wear a good support or jogging bra for breast tenderness during pregnancy. This may help if worn during sleep. Pads or tissues may be used in the bra if you are leaking colostrum.   Do not use hot tubs, steam rooms or saunas throughout the pregnancy.   Wear your seat belt at all times when driving. This protects you and your baby if you are in an accident.   Avoid raw meat, uncooked cheese, cat litter boxes and soil used by cats. These carry germs that can cause birth defects in the baby.   The second trimester is also a good time to visit your dentist for your dental health if this has not been done yet. Getting your teeth cleaned is OK. Use a soft toothbrush. Brush gently during pregnancy.   It is easier to loose urine during pregnancy. Tightening up and strengthening the pelvic muscles will  help with this problem. Practice stopping your urination while you are going to the bathroom. These are the same muscles you need to strengthen. It is also the muscles you would use as if you were trying to stop from passing gas. You can practice tightening these muscles up 10 times a set and repeating this about 3 times per day. Once you know what muscles to tighten up, do not perform these exercises during urination. It is more likely to contribute to an infection by backing up the urine.   Ask for help if you have financial, counseling or nutritional needs during pregnancy. Your caregiver will be able to offer counseling for these needs as well as refer you for other special needs.   Your skin may become oily. If so, wash your face with mild soap, use non-greasy moisturizer and oil or cream based makeup.  MEDICATIONS AND DRUG USE IN PREGNANCY  Take prenatal vitamins as directed. The vitamin should contain 1 milligram of folic acid. Keep all vitamins out of reach of children. Only a couple vitamins or tablets containing iron may be fatal to a baby or young child when ingested.   Avoid use of all medications, including herbs, over-the-counter medications, not prescribed or suggested by your caregiver. Only take over-the-counter or prescription medicines for pain, discomfort, or fever as directed by your caregiver. Do not use aspirin.   Let your caregiver also know about herbs you may be using.   Alcohol is related to a number of birth defects. This includes fetal alcohol syndrome. All alcohol, in any form, should be avoided completely. Smoking will cause low birth rate and premature babies.   Street or illegal drugs are very harmful to the baby. They are absolutely forbidden. A baby born to an addicted mother will be addicted at birth. The baby will go through the same withdrawal an adult does.  SEEK MEDICAL CARE IF:  You have any concerns or worries during your pregnancy. It is better to call with  your questions if you feel they cannot wait, rather than worry about them. SEEK IMMEDIATE MEDICAL CARE IF:   An unexplained oral temperature above 102 F (38.9 C) develops, or as your caregiver suggests.   You have leaking of fluid from the vagina (birth canal). If leaking membranes are suspected, take your temperature and tell your caregiver of this when you call.   There   is vaginal spotting, bleeding, or passing clots. Tell your caregiver of the amount and how many pads are used. Light spotting in pregnancy is common, especially following intercourse.   You develop a bad smelling vaginal discharge with a change in the color from clear to white.   You continue to feel sick to your stomach (nauseated) and have no relief from remedies suggested. You vomit blood or coffee ground-like materials.   You lose more than 2 pounds of weight or gain more than 2 pounds of weight over 1 week, or as suggested by your caregiver.   You notice swelling of your face, hands, feet, or legs.   You get exposed to [place] measles and have never had them.   You are exposed to fifth disease or chickenpox.   You develop belly (abdominal) pain. Round ligament discomfort is a common non-cancerous (benign) cause of abdominal pain in pregnancy. Your caregiver still must evaluate you.   You develop a bad headache that does not go away.   You develop fever, diarrhea, pain with urination, or shortness of breath.   You develop visual problems, blurry, or double vision.   You fall or are in a car accident or any kind of trauma.   There is mental or physical violence at home.  Document Released: 12/30/2000 Document Revised: 09/17/2010 Document Reviewed: 07/04/2008 [employer] Patient Information 2012 [employer]. 

## 2010-11-10 NOTE — Progress Notes (Signed)
Feeling well, no complaints. Integrated screen on Friday with MFM. Anticipatory guidance

## 2010-11-10 NOTE — Progress Notes (Signed)
p=[id number] 

## 2010-11-14 ENCOUNTER — Other Ambulatory Visit: Payer: Self-pay

## 2010-11-14 ENCOUNTER — Ambulatory Visit (HOSPITAL_COMMUNITY)
Admission: RE | Admit: 2010-11-14 | Discharge: 2010-11-14 | Disposition: A | Discharge status: Home / Self Care | Payer: Commercial Managed Care - PPO | Source: Ambulatory Visit | Attending: Physician Assistant | Admitting: Physician Assistant

## 2010-11-14 DIAGNOSIS — O351XX0 Maternal care for (suspected) chromosomal abnormality in fetus, not applicable or unspecified: Secondary | ICD-10-CM | POA: Insufficient documentation

## 2010-11-14 DIAGNOSIS — O3510X0 Maternal care for (suspected) chromosomal abnormality in fetus, unspecified, not applicable or unspecified: Secondary | ICD-10-CM | POA: Insufficient documentation

## 2010-11-14 DIAGNOSIS — Z3490 Encounter for supervision of normal pregnancy, unspecified, unspecified trimester: Secondary | ICD-10-CM

## 2010-11-14 DIAGNOSIS — Q270 Congenital absence and hypoplasia of umbilical artery: Secondary | ICD-10-CM | POA: Insufficient documentation

## 2010-11-14 DIAGNOSIS — Z3689 Encounter for other specified antenatal screening: Secondary | ICD-10-CM | POA: Insufficient documentation

## 2010-11-14 NOTE — Progress Notes (Signed)
Nuchal translucency ultrasound performed today.  See report in [facility].

## 2010-12-09 ENCOUNTER — Ambulatory Visit (INDEPENDENT_AMBULATORY_CARE_PROVIDER_SITE_OTHER): Payer: Commercial Managed Care - PPO | Admitting: Obstetrics & Gynecology

## 2010-12-09 DIAGNOSIS — Z3490 Encounter for supervision of normal pregnancy, unspecified, unspecified trimester: Secondary | ICD-10-CM

## 2010-12-09 DIAGNOSIS — Z3480 Encounter for supervision of other normal pregnancy, unspecified trimester: Secondary | ICD-10-CM

## 2010-12-09 DIAGNOSIS — Q270 Congenital absence and hypoplasia of umbilical artery: Secondary | ICD-10-CM

## 2010-12-09 NOTE — Progress Notes (Signed)
[phone] 

## 2010-12-09 NOTE — Patient Instructions (Addendum)
Pregnancy - Second Trimester The second trimester of pregnancy (3 to 6 months) is a period of rapid growth for you and your baby. At the end of the sixth month, your baby is about 9 inches long and weighs 1 1/2 pounds. You will begin to feel the baby move between 18 and 20 weeks of the pregnancy. This is called quickening. Weight gain is faster. A clear fluid (colostrum) may leak out of your breasts. You may feel small contractions of the womb (uterus). This is known as false labor or Braxton-Hicks contractions. This is like a practice for labor when the baby is ready to be born. Usually, the problems with morning sickness have usually passed by the end of your first trimester. Some women develop small dark blotches (called cholasma, mask of pregnancy) on their face that usually goes away after the baby is born. Exposure to the sun makes the blotches worse. Acne may also develop in some pregnant women and pregnant women who have acne, may find that it goes away. PRENATAL EXAMS  Blood work may continue to be done during prenatal exams. These tests are done to check on your health and the probable health of your baby. Blood work is used to follow your blood levels (hemoglobin). Anemia (low hemoglobin) is common during pregnancy. Iron and vitamins are given to help prevent this. You will also be checked for diabetes between 24 and 28 weeks of the pregnancy. Some of the previous blood tests may be repeated.   The size of the uterus is measured during each visit. This is to make sure that the baby is continuing to grow properly according to the dates of the pregnancy.   Your blood pressure is checked every prenatal visit. This is to make sure you are not getting toxemia.   Your urine is checked to make sure you do not have an infection, diabetes or protein in the urine.   Your weight is checked often to make sure gains are happening at the suggested rate. This is to ensure that both you and your baby are  growing normally.   Sometimes, an ultrasound is performed to confirm the proper growth and development of the baby. This is a test which bounces harmless sound waves off the baby so your caregiver can more accurately determine due dates.  Sometimes, a specialized test is done on the amniotic fluid surrounding the baby. This test is called an amniocentesis. The amniotic fluid is obtained by sticking a needle into the belly (abdomen). This is done to check the chromosomes in instances where there is a concern about possible genetic problems with the baby. It is also sometimes done near the end of pregnancy if an early delivery is required. In this case, it is done to help make sure the baby's lungs are mature enough for the baby to live outside of the womb. CHANGES OCCURING IN THE SECOND TRIMESTER OF PREGNANCY Your body goes through many changes during pregnancy. They vary from person to person. Talk to your caregiver about changes you notice that you are concerned about.  During the second trimester, you will likely have an increase in your appetite. It is normal to have cravings for certain foods. This varies from person to person and pregnancy to pregnancy.   Your lower abdomen will begin to bulge.   You may have to urinate more often because the uterus and baby are pressing on your bladder. It is also common to get more bladder infections during pregnancy (  pain with urination). You can help this by drinking lots of fluids and emptying your bladder before and after intercourse.   You may begin to get stretch marks on your hips, abdomen, and breasts. These are normal changes in the body during pregnancy. There are no exercises or medications to take that prevent this change.   You may begin to develop swollen and bulging veins (varicose veins) in your legs. Wearing support hose, elevating your feet for 15 minutes, 3 to 4 times a day and limiting salt in your diet helps lessen the problem.    Heartburn may develop as the uterus grows and pushes up against the stomach. Antacids recommended by your caregiver helps with this problem. Also, eating smaller meals 4 to 5 times a day helps.   Constipation can be treated with a stool softener or adding bulk to your diet. Drinking lots of fluids, vegetables, fruits, and whole grains are helpful.   Exercising is also helpful. If you have been very active up until your pregnancy, most of these activities can be continued during your pregnancy. If you have been less active, it is helpful to start an exercise program such as walking.   Hemorrhoids (varicose veins in the rectum) may develop at the end of the second trimester. Warm sitz baths and hemorrhoid cream recommended by your caregiver helps hemorrhoid problems.   Backaches may develop during this time of your pregnancy. Avoid heavy lifting, wear low heal shoes and practice good posture to help with backache problems.   Some pregnant women develop tingling and numbness of their hand and fingers because of swelling and tightening of ligaments in the wrist (carpel tunnel syndrome). This goes away after the baby is born.   As your breasts enlarge, you may have to get a bigger bra. Get a comfortable, cotton, support bra. Do not get a nursing bra until the last month of the pregnancy if you will be nursing the baby.   You may get a dark line from your belly button to the pubic area called the linea nigra.   You may develop rosy cheeks because of increase blood flow to the face.   You may develop spider looking lines of the face, neck, arms and chest. These go away after the baby is born.  HOME CARE INSTRUCTIONS   It is extremely important to avoid all smoking, herbs, alcohol, and unprescribed drugs during your pregnancy. These chemicals affect the formation and growth of the baby. Avoid these chemicals throughout the pregnancy to ensure the delivery of a healthy infant.   Most of your home  care instructions are the same as suggested for the first trimester of your pregnancy. Keep your caregiver's appointments. Follow your caregiver's instructions regarding medication use, exercise and diet.   During pregnancy, you are providing food for you and your baby. Continue to eat regular, well-balanced meals. Choose foods such as meat, fish, milk and other low fat dairy products, vegetables, fruits, and whole-grain breads and cereals. Your caregiver will tell you of the ideal weight gain.   A physical sexual relationship may be continued up until near the end of pregnancy if there are no other problems. Problems could include early (premature) leaking of amniotic fluid from the membranes, vaginal bleeding, abdominal pain, or other medical or pregnancy problems.   Exercise regularly if there are no restrictions. Check with your caregiver if you are unsure of the safety of some of your exercises. The greatest weight gain will occur in the   last 2 trimesters of pregnancy. Exercise will help you:   Control your weight.   Get you in shape for labor and delivery.   Lose weight after you have the baby.   Wear a good support or jogging bra for breast tenderness during pregnancy. This may help if worn during sleep. Pads or tissues may be used in the bra if you are leaking colostrum.   Do not use hot tubs, steam rooms or saunas throughout the pregnancy.   Wear your seat belt at all times when driving. This protects you and your baby if you are in an accident.   Avoid raw meat, uncooked cheese, cat litter boxes and soil used by cats. These carry germs that can cause birth defects in the baby.   The second trimester is also a good time to visit your dentist for your dental health if this has not been done yet. Getting your teeth cleaned is OK. Use a soft toothbrush. Brush gently during pregnancy.   It is easier to loose urine during pregnancy. Tightening up and strengthening the pelvic muscles will  help with this problem. Practice stopping your urination while you are going to the bathroom. These are the same muscles you need to strengthen. It is also the muscles you would use as if you were trying to stop from passing gas. You can practice tightening these muscles up 10 times a set and repeating this about 3 times per day. Once you know what muscles to tighten up, do not perform these exercises during urination. It is more likely to contribute to an infection by backing up the urine.   Ask for help if you have financial, counseling or nutritional needs during pregnancy. Your caregiver will be able to offer counseling for these needs as well as refer you for other special needs.   Your skin may become oily. If so, wash your face with mild soap, use non-greasy moisturizer and oil or cream based makeup.  MEDICATIONS AND DRUG USE IN PREGNANCY  Take prenatal vitamins as directed. The vitamin should contain 1 milligram of folic acid. Keep all vitamins out of reach of children. Only a couple vitamins or tablets containing iron may be fatal to a baby or young child when ingested.   Avoid use of all medications, including herbs, over-the-counter medications, not prescribed or suggested by your caregiver. Only take over-the-counter or prescription medicines for pain, discomfort, or fever as directed by your caregiver. Do not use aspirin.   Let your caregiver also know about herbs you may be using.   Alcohol is related to a number of birth defects. This includes fetal alcohol syndrome. All alcohol, in any form, should be avoided completely. Smoking will cause low birth rate and premature babies.   Street or illegal drugs are very harmful to the baby. They are absolutely forbidden. A baby born to an addicted mother will be addicted at birth. The baby will go through the same withdrawal an adult does.  SEEK MEDICAL CARE IF:  You have any concerns or worries during your pregnancy. It is better to call with  your questions if you feel they cannot wait, rather than worry about them. SEEK IMMEDIATE MEDICAL CARE IF:   An unexplained oral temperature above 102 F (38.9 C) develops, or as your caregiver suggests.   You have leaking of fluid from the vagina (birth canal). If leaking membranes are suspected, take your temperature and tell your caregiver of this when you call.   There   is vaginal spotting, bleeding, or passing clots. Tell your caregiver of the amount and how many pads are used. Light spotting in pregnancy is common, especially following intercourse.   You develop a bad smelling vaginal discharge with a change in the color from clear to white.   You continue to feel sick to your stomach (nauseated) and have no relief from remedies suggested. You vomit blood or coffee ground-like materials.   You lose more than 2 pounds of weight or gain more than 2 pounds of weight over 1 week, or as suggested by your caregiver.   You notice swelling of your face, hands, feet, or legs.   You get exposed to German measles and have never had them.   You are exposed to fifth disease or chickenpox.   You develop belly (abdominal) pain. Round ligament discomfort is a common non-cancerous (benign) cause of abdominal pain in pregnancy. Your caregiver still must evaluate you.   You develop a bad headache that does not go away.   You develop fever, diarrhea, pain with urination, or shortness of breath.   You develop visual problems, blurry, or double vision.   You fall or are in a car accident or any kind of trauma.   There is mental or physical violence at home.  Document Released: 12/30/2000 Document Revised: 09/17/2010 Document Reviewed: 07/04/2008 [employer] Patient Information 2012 [employer]. 

## 2010-12-09 NOTE — Progress Notes (Signed)
No complaints.  Fetal movement and preterm labor precautions reviewed. Return to clinic in 4 weeks. 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 If confirmed, will need serial growth scans.

## 2010-12-10 ENCOUNTER — Ambulatory Visit (HOSPITAL_COMMUNITY)
Admission: RE | Admit: 2010-12-10 | Discharge: 2010-12-10 | Disposition: A | Discharge status: Home / Self Care | Payer: Commercial Managed Care - PPO | Source: Ambulatory Visit | Attending: Maternal and Fetal Medicine | Admitting: Maternal and Fetal Medicine

## 2010-12-10 ENCOUNTER — Ambulatory Visit (HOSPITAL_COMMUNITY): Payer: Commercial Managed Care - PPO

## 2010-12-10 DIAGNOSIS — Q270 Congenital absence and hypoplasia of umbilical artery: Secondary | ICD-10-CM

## 2010-12-10 DIAGNOSIS — Z363 Encounter for antenatal screening for malformations: Secondary | ICD-10-CM | POA: Insufficient documentation

## 2010-12-10 DIAGNOSIS — IMO0001 Reserved for inherently not codable concepts without codable children: Secondary | ICD-10-CM | POA: Insufficient documentation

## 2010-12-10 DIAGNOSIS — Z3490 Encounter for supervision of normal pregnancy, unspecified, unspecified trimester: Secondary | ICD-10-CM

## 2010-12-10 DIAGNOSIS — Z1389 Encounter for screening for other disorder: Secondary | ICD-10-CM | POA: Insufficient documentation

## 2010-12-10 DIAGNOSIS — O358XX0 Maternal care for other (suspected) fetal abnormality and damage, not applicable or unspecified: Secondary | ICD-10-CM | POA: Insufficient documentation

## 2010-12-10 NOTE — Progress Notes (Signed)
Ultrasound in [facility].  Follow up U/S scheduled 

## 2010-12-31 ENCOUNTER — Ambulatory Visit (HOSPITAL_COMMUNITY)
Admission: RE | Admit: 2010-12-31 | Discharge: 2010-12-31 | Disposition: A | Discharge status: Home / Self Care | Payer: Commercial Managed Care - PPO | Source: Ambulatory Visit | Attending: Obstetrics & Gynecology | Admitting: Obstetrics & Gynecology

## 2010-12-31 DIAGNOSIS — IMO0001 Reserved for inherently not codable concepts without codable children: Secondary | ICD-10-CM | POA: Insufficient documentation

## 2010-12-31 DIAGNOSIS — Q270 Congenital absence and hypoplasia of umbilical artery: Secondary | ICD-10-CM

## 2011-01-07 ENCOUNTER — Ambulatory Visit (INDEPENDENT_AMBULATORY_CARE_PROVIDER_SITE_OTHER): Payer: Commercial Managed Care - PPO | Admitting: Obstetrics & Gynecology

## 2011-01-07 ENCOUNTER — Encounter: Payer: Self-pay | Admitting: Obstetrics & Gynecology

## 2011-01-07 VITALS — BP 113/66 | Temp 97.0°F | Wt 176.0 lb

## 2011-01-07 DIAGNOSIS — Q270 Congenital absence and hypoplasia of umbilical artery: Secondary | ICD-10-CM

## 2011-01-07 DIAGNOSIS — Z3490 Encounter for supervision of normal pregnancy, unspecified, unspecified trimester: Secondary | ICD-10-CM

## 2011-01-07 DIAGNOSIS — Z3480 Encounter for supervision of other normal pregnancy, unspecified trimester: Secondary | ICD-10-CM

## 2011-01-07 NOTE — Progress Notes (Signed)
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  Needs to have a post natal check up.  2 vessel cord.

## 2011-01-07 NOTE — Patient Instructions (Signed)
Breastfeeding BENEFITS OF BREASTFEEDING For the baby  The first milk (colostrum) helps the baby's digestive system function better.   There are antibodies from the mother in the milk that help the baby fight off infections.   The baby has a lower incidence of asthma, allergies, and SIDS (sudden infant death syndrome).   The nutrients in breast milk are better than formulas for the baby and helps the baby's brain grow better.   Babies who breastfeed have less gas, colic, and constipation.  For the mother  Breastfeeding helps develop a very special bond between mother and baby.   It is more convenient, always available at the correct temperature and cheaper than formula feeding.   It burns calories in the mother and helps with losing weight that was gained during pregnancy.   It makes the uterus contract back down to normal size faster and slows bleeding following delivery.   Breastfeeding mothers have a lower risk of developing breast cancer.  NURSE FREQUENTLY  A healthy, full-term baby may breastfeed as often as every hour or space his or her feedings to every 3 hours.   How often to nurse will vary from baby to baby. Watch your baby for signs of hunger, not the clock.   Nurse as often as the baby requests, or when you feel the need to reduce the fullness of your breasts.   Awaken the baby if it has been 3 to 4 hours since the last feeding.   Frequent feeding will help the mother make more milk and will prevent problems like sore nipples and engorgement of the breasts.  BABY'S POSITION AT THE BREAST  Whether lying down or sitting, be sure that the baby's tummy is facing your tummy.   Support the breast with 4 fingers underneath the breast and the thumb above. Make sure your fingers are well away from the nipple and baby's mouth.   Stroke the baby's lips and cheek closest to the breast gently with your finger or nipple.   When the baby's mouth is open wide enough, place  all of your nipple and as much of the dark area around the nipple as possible into your baby's mouth.   Pull the baby in close so the tip of the nose and the baby's cheeks touch the breast during the feeding.  FEEDINGS  The length of each feeding varies from baby to baby and from feeding to feeding.   The baby must suck about 2 to 3 minutes for your milk to get to him or her. This is called a "let down." For this reason, allow the baby to feed on each breast as long as he or she wants. Your baby will end the feeding when he or she has received the right balance of nutrients.   To break the suction, put your finger into the corner of the baby's mouth and slide it between his or her gums before removing your breast from his or her mouth. This will help prevent sore nipples.  REDUCING BREAST ENGORGEMENT  In the first week after your baby is born, you may experience signs of breast engorgement. When breasts are engorged, they feel heavy, warm, full, and may be tender to the touch. You can reduce engorgement if you:   Nurse frequently, every 2 to 3 hours. Mothers who breastfeed early and often have fewer problems with engorgement.   Place light ice packs on your breasts between feedings. This reduces swelling. Wrap the ice packs in a   lightweight towel to protect your skin.   Apply moist hot packs to your breast for 5 to 10 minutes before each feeding. This increases circulation and helps the milk flow.   Gently massage your breast before and during the feeding.   Make sure that the baby empties at least one breast at every feeding before switching sides.   Use a breast pump to empty the breasts if your baby is sleepy or not nursing well. You may also want to pump if you are returning to work or or you feel you are getting engorged.   Avoid bottle feeds, pacifiers or supplemental feedings of water or juice in place of breastfeeding.   Be sure the baby is latched on and positioned properly while  breastfeeding.   Prevent fatigue, stress, and anemia.   Wear a supportive bra, avoiding underwire styles.   Eat a balanced diet with enough fluids.  If you follow these suggestions, your engorgement should improve in 24 to 48 hours. If you are still experiencing difficulty, call your lactation consultant or caregiver. IS MY BABY GETTING ENOUGH MILK? Sometimes, mothers worry about whether their babies are getting enough milk. You can be assured that your baby is getting enough milk if:  The baby is actively sucking and you hear swallowing.   The baby nurses at least 8 to 12 times in a 24 hour time period. Nurse your baby until he or she unlatches or falls asleep at the first breast (at least 10 to 20 minutes), then offer the second side.   The baby is wetting 5 to 6 disposable diapers (6 to 8 cloth diapers) in a 24 hour period by 5 to 6 days of age.   The baby is having at least 2 to 3 stools every 24 hours for the first few months. Breast milk is all the food your baby needs. It is not necessary for your baby to have water or formula. In fact, to help your breasts make more milk, it is best not to give your baby supplemental feedings during the early weeks.   The stool should be soft and yellow.   The baby should gain 4 to 7 ounces per week after he is 4 days old.  TAKE CARE OF YOURSELF Take care of your breasts by:  Bathing or showering daily.   Avoiding the use of soaps on your nipples.   Start feedings on your left breast at one feeding and on your right breast at the next feeding.   You will notice an increase in your milk supply 2 to 5 days after delivery. You may feel some discomfort from engorgement, which makes your breasts very firm and often tender. Engorgement "peaks" out within 24 to 48 hours. In the meantime, apply warm moist towels to your breasts for 5 to 10 minutes before feeding. Gentle massage and expression of some milk before feeding will soften your breasts, making  it easier for your baby to latch on. Wear a well fitting nursing bra and air dry your nipples for 10 to 15 minutes after each feeding.   Only use cotton bra pads.   Only use pure lanolin on your nipples after nursing. You do not need to wash it off before nursing.  Take care of yourself by:   Eating well-balanced meals and nutritious snacks.   Drinking milk, fruit juice, and water to satisfy your thirst (about 8 glasses a day).   Getting plenty of rest.   Increasing calcium in   your diet (1200 mg a day).   Avoiding foods that you notice affect the baby in a bad way.  SEEK MEDICAL CARE IF:   You have any questions or difficulty with breastfeeding.   You need help.   You have a hard, red, sore area on your breast, accompanied by a fever of 100.5 F (38.1 C) or more.   Your baby is too sleepy to eat well or is having trouble sleeping.   Your baby is wetting less than 6 diapers per day, by 5 days of age.   Your baby's skin or white part of his or her eyes is more yellow than it was in the hospital.   You feel depressed.  Document Released: 01/05/2005 Document Revised: 09/17/2010 Document Reviewed: 08/20/2008 [employer] Patient Information 2012 [employer]. 

## 2011-01-07 NOTE — Progress Notes (Signed)
p-84  Occ. Lower back pain.

## 2011-01-28 ENCOUNTER — Encounter: Payer: Self-pay | Admitting: Obstetrics & Gynecology

## 2011-02-03 ENCOUNTER — Ambulatory Visit (INDEPENDENT_AMBULATORY_CARE_PROVIDER_SITE_OTHER): Payer: Commercial Managed Care - PPO | Admitting: Obstetrics & Gynecology

## 2011-02-03 VITALS — BP 128/82 | Temp 98.0°F | Wt 188.0 lb

## 2011-02-03 DIAGNOSIS — Z3400 Encounter for supervision of normal first pregnancy, unspecified trimester: Secondary | ICD-10-CM

## 2011-02-03 NOTE — Progress Notes (Signed)
Routine visit. No complaints. Has ultrasound for growth with MFM next week scheduled. Good FM. 1 hour glucola in 2 weeks.

## 2011-02-03 NOTE — Progress Notes (Signed)
p - 111 

## 2011-02-11 ENCOUNTER — Other Ambulatory Visit: Payer: Self-pay | Admitting: Obstetrics & Gynecology

## 2011-02-11 ENCOUNTER — Ambulatory Visit (HOSPITAL_COMMUNITY)
Admission: RE | Admit: 2011-02-11 | Discharge: 2011-02-11 | Disposition: A | Discharge status: Home / Self Care | Payer: Commercial Managed Care - PPO | Source: Ambulatory Visit | Attending: Obstetrics & Gynecology | Admitting: Obstetrics & Gynecology

## 2011-02-11 DIAGNOSIS — Z0489 Encounter for examination and observation for other specified reasons: Secondary | ICD-10-CM

## 2011-02-11 DIAGNOSIS — O358XX0 Maternal care for other (suspected) fetal abnormality and damage, not applicable or unspecified: Secondary | ICD-10-CM | POA: Insufficient documentation

## 2011-02-11 DIAGNOSIS — IMO0001 Reserved for inherently not codable concepts without codable children: Secondary | ICD-10-CM | POA: Insufficient documentation

## 2011-02-11 DIAGNOSIS — IMO0002 Reserved for concepts with insufficient information to code with codable children: Secondary | ICD-10-CM

## 2011-02-18 ENCOUNTER — Ambulatory Visit (INDEPENDENT_AMBULATORY_CARE_PROVIDER_SITE_OTHER): Payer: Commercial Managed Care - PPO | Admitting: Obstetrics & Gynecology

## 2011-02-18 VITALS — BP 117/78 | Temp 97.2°F | Wt 188.0 lb

## 2011-02-18 DIAGNOSIS — Z3490 Encounter for supervision of normal pregnancy, unspecified, unspecified trimester: Secondary | ICD-10-CM

## 2011-02-18 DIAGNOSIS — Z3480 Encounter for supervision of other normal pregnancy, unspecified trimester: Secondary | ICD-10-CM

## 2011-02-18 LAB — CBC
HCT: 36.8 % (ref 36.0–46.0)
Hemoglobin: 12.1 g/dL (ref 12.0–15.0)
MCH: 30.1 pg (ref 26.0–34.0)
MCHC: 32.9 g/dL (ref 30.0–36.0)
MCV: 91.5 fL (ref 78.0–100.0)
Platelets: 271 10*3/uL (ref 150–400)
RBC: 4.02 MIL/uL (ref 3.87–5.11)
RDW: 13.4 % (ref 11.5–15.5)
WBC: 7.3 10*3/uL (ref 4.0–10.5)

## 2011-02-18 NOTE — Progress Notes (Signed)
55% growth on 02/11/10.  Needs to f/u pylectasis in 6 weeks.  GCT today.

## 2011-02-18 NOTE — Patient Instructions (Signed)
Breastfeeding BENEFITS OF BREASTFEEDING For the baby  The first milk (colostrum) helps the baby's digestive system function better.   There are antibodies from the mother in the milk that help the baby fight off infections.   The baby has a lower incidence of asthma, allergies, and SIDS (sudden infant death syndrome).   The nutrients in breast milk are better than formulas for the baby and helps the baby's brain grow better.   Babies who breastfeed have less gas, colic, and constipation.  For the mother  Breastfeeding helps develop a very special bond between mother and baby.   It is more convenient, always available at the correct temperature and cheaper than formula feeding.   It burns calories in the mother and helps with losing weight that was gained during pregnancy.   It makes the uterus contract back down to normal size faster and slows bleeding following delivery.   Breastfeeding mothers have a lower risk of developing breast cancer.  NURSE FREQUENTLY  A healthy, full-term baby may breastfeed as often as every hour or space his or her feedings to every 3 hours.   How often to nurse will vary from baby to baby. Watch your baby for signs of hunger, not the clock.   Nurse as often as the baby requests, or when you feel the need to reduce the fullness of your breasts.   Awaken the baby if it has been 3 to 4 hours since the last feeding.   Frequent feeding will help the mother make more milk and will prevent problems like sore nipples and engorgement of the breasts.  BABY'S POSITION AT THE BREAST  Whether lying down or sitting, be sure that the baby's tummy is facing your tummy.   Support the breast with 4 fingers underneath the breast and the thumb above. Make sure your fingers are well away from the nipple and baby's mouth.   Stroke the baby's lips and cheek closest to the breast gently with your finger or nipple.   When the baby's mouth is open wide enough, place  all of your nipple and as much of the dark area around the nipple as possible into your baby's mouth.   Pull the baby in close so the tip of the nose and the baby's cheeks touch the breast during the feeding.  FEEDINGS  The length of each feeding varies from baby to baby and from feeding to feeding.   The baby must suck about 2 to 3 minutes for your milk to get to him or her. This is called a "let down." For this reason, allow the baby to feed on each breast as long as he or she wants. Your baby will end the feeding when he or she has received the right balance of nutrients.   To break the suction, put your finger into the corner of the baby's mouth and slide it between his or her gums before removing your breast from his or her mouth. This will help prevent sore nipples.  REDUCING BREAST ENGORGEMENT  In the first week after your baby is born, you may experience signs of breast engorgement. When breasts are engorged, they feel heavy, warm, full, and may be tender to the touch. You can reduce engorgement if you:   Nurse frequently, every 2 to 3 hours. Mothers who breastfeed early and often have fewer problems with engorgement.   Place light ice packs on your breasts between feedings. This reduces swelling. Wrap the ice packs in a   lightweight towel to protect your skin.   Apply moist hot packs to your breast for 5 to 10 minutes before each feeding. This increases circulation and helps the milk flow.   Gently massage your breast before and during the feeding.   Make sure that the baby empties at least one breast at every feeding before switching sides.   Use a breast pump to empty the breasts if your baby is sleepy or not nursing well. You may also want to pump if you are returning to work or or you feel you are getting engorged.   Avoid bottle feeds, pacifiers or supplemental feedings of water or juice in place of breastfeeding.   Be sure the baby is latched on and positioned properly while  breastfeeding.   Prevent fatigue, stress, and anemia.   Wear a supportive bra, avoiding underwire styles.   Eat a balanced diet with enough fluids.  If you follow these suggestions, your engorgement should improve in 24 to 48 hours. If you are still experiencing difficulty, call your lactation consultant or caregiver. IS MY BABY GETTING ENOUGH MILK? Sometimes, mothers worry about whether their babies are getting enough milk. You can be assured that your baby is getting enough milk if:  The baby is actively sucking and you hear swallowing.   The baby nurses at least 8 to 12 times in a 24 hour time period. Nurse your baby until he or she unlatches or falls asleep at the first breast (at least 10 to 20 minutes), then offer the second side.   The baby is wetting 5 to 6 disposable diapers (6 to 8 cloth diapers) in a 24 hour period by 5 to 6 days of age.   The baby is having at least 2 to 3 stools every 24 hours for the first few months. Breast milk is all the food your baby needs. It is not necessary for your baby to have water or formula. In fact, to help your breasts make more milk, it is best not to give your baby supplemental feedings during the early weeks.   The stool should be soft and yellow.   The baby should gain 4 to 7 ounces per week after he is 4 days old.  TAKE CARE OF YOURSELF Take care of your breasts by:  Bathing or showering daily.   Avoiding the use of soaps on your nipples.   Start feedings on your left breast at one feeding and on your right breast at the next feeding.   You will notice an increase in your milk supply 2 to 5 days after delivery. You may feel some discomfort from engorgement, which makes your breasts very firm and often tender. Engorgement "peaks" out within 24 to 48 hours. In the meantime, apply warm moist towels to your breasts for 5 to 10 minutes before feeding. Gentle massage and expression of some milk before feeding will soften your breasts, making  it easier for your baby to latch on. Wear a well fitting nursing bra and air dry your nipples for 10 to 15 minutes after each feeding.   Only use cotton bra pads.   Only use pure lanolin on your nipples after nursing. You do not need to wash it off before nursing.  Take care of yourself by:   Eating well-balanced meals and nutritious snacks.   Drinking milk, fruit juice, and water to satisfy your thirst (about 8 glasses a day).   Getting plenty of rest.   Increasing calcium in   your diet (1200 mg a day).   Avoiding foods that you notice affect the baby in a bad way.  SEEK MEDICAL CARE IF:   You have any questions or difficulty with breastfeeding.   You need help.   You have a hard, red, sore area on your breast, accompanied by a fever of 100.5 F (38.1 C) or more.   Your baby is too sleepy to eat well or is having trouble sleeping.   Your baby is wetting less than 6 diapers per day, by 5 days of age.   Your baby's skin or white part of his or her eyes is more yellow than it was in the hospital.   You feel depressed.  Document Released: 01/05/2005 Document Revised: 09/17/2010 Document Reviewed: 08/20/2008 [employer] Patient Information 2012 [employer]. 

## 2011-02-19 ENCOUNTER — Telehealth: Payer: Self-pay | Admitting: *Deleted

## 2011-02-19 LAB — RPR

## 2011-02-19 LAB — GLUCOSE TOLERANCE, 1 HOUR: Glucose, 1 Hour GTT: 153 mg/dL — ABNORMAL HIGH (ref 70–140)

## 2011-02-19 LAB — HIV ANTIBODY (ROUTINE TESTING W REFLEX): HIV: NONREACTIVE

## 2011-02-19 NOTE — Telephone Encounter (Signed)
Pt notified of abn 1 hr GTT and she is now scheduled for a 3 hr GTT on 02/23/11.  Pt aware that she does need to be fasting for this test.

## 2011-02-22 ENCOUNTER — Encounter: Payer: Self-pay | Admitting: Obstetrics & Gynecology

## 2011-02-22 DIAGNOSIS — O359XX0 Maternal care for (suspected) fetal abnormality and damage, unspecified, not applicable or unspecified: Secondary | ICD-10-CM | POA: Insufficient documentation

## 2011-02-22 HISTORY — DX: Maternal care for (suspected) fetal abnormality and damage, unspecified, not applicable or unspecified: O35.9XX0

## 2011-02-23 ENCOUNTER — Other Ambulatory Visit (INDEPENDENT_AMBULATORY_CARE_PROVIDER_SITE_OTHER): Payer: Commercial Managed Care - PPO | Admitting: *Deleted

## 2011-02-23 DIAGNOSIS — R7309 Other abnormal glucose: Secondary | ICD-10-CM

## 2011-02-23 NOTE — Progress Notes (Signed)
3 hr GTT only today

## 2011-02-24 LAB — GLUCOSE TOLERANCE, 3 HOURS
Glucose Tolerance, 1 hour: 163 mg/dL (ref 70–189)
Glucose Tolerance, 2 hour: 154 mg/dL (ref 70–164)
Glucose Tolerance, Fasting: 82 mg/dL (ref 70–104)
Glucose, GTT - 3 Hour: 138 mg/dL (ref 70–144)

## 2011-02-25 ENCOUNTER — Telehealth: Payer: Self-pay | Admitting: *Deleted

## 2011-02-25 NOTE — Telephone Encounter (Signed)
LM on cell number of normal 3 hr GTT.

## 2011-03-06 ENCOUNTER — Encounter: Payer: Self-pay | Admitting: Advanced Practice Midwife

## 2011-03-06 ENCOUNTER — Ambulatory Visit (INDEPENDENT_AMBULATORY_CARE_PROVIDER_SITE_OTHER): Payer: Commercial Managed Care - PPO | Admitting: Advanced Practice Midwife

## 2011-03-06 VITALS — BP 116/70 | Temp 97.4°F | Wt 192.0 lb

## 2011-03-06 DIAGNOSIS — Z3480 Encounter for supervision of other normal pregnancy, unspecified trimester: Secondary | ICD-10-CM

## 2011-03-06 DIAGNOSIS — Q270 Congenital absence and hypoplasia of umbilical artery: Secondary | ICD-10-CM

## 2011-03-06 NOTE — Progress Notes (Signed)
Well, no c/o. Has repeat u/s scheduled in early March. Rev'd kick counts and PTL signs and symptoms. Planning on breastfeeding, encouraged breastfeeding education.

## 2011-03-06 NOTE — Progress Notes (Signed)
p-80 

## 2011-03-06 NOTE — Patient Instructions (Signed)
Pregnancy - Third Trimester The third trimester of pregnancy (the last 3 months) is a period of the most rapid growth for you and your baby. The baby approaches a length of 20 inches and a weight of 6 to 10 pounds. The baby is adding on fat and getting ready for life outside your body. While inside, babies have periods of sleeping and waking, suck their thumbs, and hiccups. You can often feel small contractions of the uterus. This is false labor. It is also called Braxton-Hicks contractions. This is like a practice for labor. The usual problems in this stage of pregnancy include more difficulty breathing, swelling of the hands and feet from water retention, and having to urinate more often because of the uterus and baby pressing on your bladder.  PRENATAL EXAMS  Blood work may continue to be done during prenatal exams. These tests are done to check on your health and the probable health of your baby. Blood work is used to follow your blood levels (hemoglobin). Anemia (low hemoglobin) is common during pregnancy. Iron and vitamins are given to help prevent this. You may also continue to be checked for diabetes. Some of the past blood tests may be done again.   The size of the uterus is measured during each visit. This makes sure your baby is growing properly according to your pregnancy dates.   Your blood pressure is checked every prenatal visit. This is to make sure you are not getting toxemia.   Your urine is checked every prenatal visit for infection, diabetes and protein.   Your weight is checked at each visit. This is done to make sure gains are happening at the suggested rate and that you and your baby are growing normally.   Sometimes, an ultrasound is performed to confirm the position and the proper growth and development of the baby. This is a test done that bounces harmless sound waves off the baby so your caregiver can more accurately determine due dates.   Discuss the type of pain  medication and anesthesia you will have during your labor and delivery.   Discuss the possibility and anesthesia if a Cesarean Section might be necessary.   Inform your caregiver if there is any mental or physical violence at home.  Sometimes, a specialized non-stress test, contraction stress test and biophysical profile are done to make sure the baby is not having a problem. Checking the amniotic fluid surrounding the baby is called an amniocentesis. The amniotic fluid is removed by sticking a needle into the belly (abdomen). This is sometimes done near the end of pregnancy if an early delivery is required. In this case, it is done to help make sure the baby's lungs are mature enough for the baby to live outside of the womb. If the lungs are not mature and it is unsafe to deliver the baby, an injection of cortisone medication is given to the mother 1 to 2 days before the delivery. This helps the baby's lungs mature and makes it safer to deliver the baby. CHANGES OCCURING IN THE THIRD TRIMESTER OF PREGNANCY Your body goes through many changes during pregnancy. They vary from person to person. Talk to your caregiver about changes you notice and are concerned about.  During the last trimester, you have probably had an increase in your appetite. It is normal to have cravings for certain foods. This varies from person to person and pregnancy to pregnancy.   You may begin to get stretch marks on your hips,   abdomen, and breasts. These are normal changes in the body during pregnancy. There are no exercises or medications to take which prevent this change.   Constipation may be treated with a stool softener or adding bulk to your diet. Drinking lots of fluids, fiber in vegetables, fruits, and whole grains are helpful.   Exercising is also helpful. If you have been very active up until your pregnancy, most of these activities can be continued during your pregnancy. If you have been less active, it is helpful  to start an exercise program such as walking. Consult your caregiver before starting exercise programs.   Avoid all smoking, alcohol, un-prescribed drugs, herbs and "street drugs" during your pregnancy. These chemicals affect the formation and growth of the baby. Avoid chemicals throughout the pregnancy to ensure the delivery of a healthy infant.   Backache, varicose veins and hemorrhoids may develop or get worse.   You will tire more easily in the third trimester, which is normal.   The baby's movements may be stronger and more often.   You may become short of breath easily.   Your belly button may stick out.   A yellow discharge may leak from your breasts called colostrum.   You may have a bloody mucus discharge. This usually occurs a few days to a week before labor begins.  HOME CARE INSTRUCTIONS   Keep your caregiver's appointments. Follow your caregiver's instructions regarding medication use, exercise, and diet.   During pregnancy, you are providing food for you and your baby. Continue to eat regular, well-balanced meals. Choose foods such as meat, fish, milk and other low fat dairy products, vegetables, fruits, and whole-grain breads and cereals. Your caregiver will tell you of the ideal weight gain.   A physical sexual relationship may be continued throughout pregnancy if there are no other problems such as early (premature) leaking of amniotic fluid from the membranes, vaginal bleeding, or belly (abdominal) pain.   Exercise regularly if there are no restrictions. Check with your caregiver if you are unsure of the safety of your exercises. Greater weight gain will occur in the last 2 trimesters of pregnancy. Exercising helps:   Control your weight.   Get you in shape for labor and delivery.   You lose weight after you deliver.   Rest a lot with legs elevated, or as needed for leg cramps or low back pain.   Wear a good support or jogging bra for breast tenderness during  pregnancy. This may help if worn during sleep. Pads or tissues may be used in the bra if you are leaking colostrum.   Do not use hot tubs, steam rooms, or saunas.   Wear your seat belt when driving. This protects you and your baby if you are in an accident.   Avoid raw meat, cat litter boxes and soil used by cats. These carry germs that can cause birth defects in the baby.   It is easier to loose urine during pregnancy. Tightening up and strengthening the pelvic muscles will help with this problem. You can practice stopping your urination while you are going to the bathroom. These are the same muscles you need to strengthen. It is also the muscles you would use if you were trying to stop from passing gas. You can practice tightening these muscles up 10 times a set and repeating this about 3 times per day. Once you know what muscles to tighten up, do not perform these exercises during urination. It is more likely   to cause an infection by backing up the urine.   Ask for help if you have financial, counseling or nutritional needs during pregnancy. Your caregiver will be able to offer counseling for these needs as well as refer you for other special needs.   Make a list of emergency phone numbers and have them available.   Plan on getting help from family or friends when you go home from the hospital.   Make a trial run to the hospital.   Take prenatal classes with the father to understand, practice and ask questions about the labor and delivery.   Prepare the baby's room/nursery.   Do not travel out of the city unless it is absolutely necessary and with the advice of your caregiver.   Wear only low or no heal shoes to have better balance and prevent falling.  MEDICATIONS AND DRUG USE IN PREGNANCY  Take prenatal vitamins as directed. The vitamin should contain 1 milligram of folic acid. Keep all vitamins out of reach of children. Only a couple vitamins or tablets containing iron may be fatal  to a baby or young child when ingested.   Avoid use of all medications, including herbs, over-the-counter medications, not prescribed or suggested by your caregiver. Only take over-the-counter or prescription medicines for pain, discomfort, or fever as directed by your caregiver. Do not use aspirin, ibuprofen (Motrin, Advil, Nuprin) or naproxen (Aleve) unless OK'd by your caregiver.   Let your caregiver also know about herbs you may be using.   Alcohol is related to a number of birth defects. This includes fetal alcohol syndrome. All alcohol, in any form, should be avoided completely. Smoking will cause low birth rate and premature babies.   Street/illegal drugs are very harmful to the baby. They are absolutely forbidden. A baby born to an addicted mother will be addicted at birth. The baby will go through the same withdrawal an adult does.  SEEK MEDICAL CARE IF: You have any concerns or worries during your pregnancy. It is better to call with your questions if you feel they cannot wait, rather than worry about them. DECISIONS ABOUT CIRCUMCISION You may or may not know the sex of your baby. If you know your baby is a boy, it may be time to think about circumcision. Circumcision is the removal of the foreskin of the penis. This is the skin that covers the sensitive end of the penis. There is no proven medical need for this. Often this decision is made on what is popular at the time or based upon religious beliefs and social issues. You can discuss these issues with your caregiver or pediatrician. SEEK IMMEDIATE MEDICAL CARE IF:   An unexplained oral temperature above 102 F (38.9 C) develops, or as your caregiver suggests.   You have leaking of fluid from the vagina (birth canal). If leaking membranes are suspected, take your temperature and tell your caregiver of this when you call.   There is vaginal spotting, bleeding or passing clots. Tell your caregiver of the amount and how many pads are  used.   You develop a bad smelling vaginal discharge with a change in the color from clear to white.   You develop vomiting that lasts more than 24 hours.   You develop chills or fever.   You develop shortness of breath.   You develop burning on urination.   You loose more than 2 pounds of weight or gain more than 2 pounds of weight or as suggested by your   caregiver.   You notice sudden swelling of your face, hands, and feet or legs.   You develop belly (abdominal) pain. Round ligament discomfort is a common non-cancerous (benign) cause of abdominal pain in pregnancy. Your caregiver still must evaluate you.   You develop a severe headache that does not go away.   You develop visual problems, blurred or double vision.   If you have not felt your baby move for more than 1 hour. If you think the baby is not moving as much as usual, eat something with sugar in it and lie down on your left side for an hour. The baby should move at least 4 to 5 times per hour. Call right away if your baby moves less than that.   You fall, are in a car accident or any kind of trauma.   There is mental or physical violence at home.  Document Released: 12/30/2000 Document Revised: 09/17/2010 Document Reviewed: 07/04/2008 [employer] Patient Information 2012 [employer]. Breastfeeding BENEFITS OF BREASTFEEDING For the baby  The first milk (colostrum) helps the baby's digestive system function better.   There are antibodies from the mother in the milk that help the baby fight off infections.   The baby has a lower incidence of asthma, allergies, and SIDS (sudden infant death syndrome).   The nutrients in breast milk are better than formulas for the baby and helps the baby's brain grow better.   Babies who breastfeed have less gas, colic, and constipation.  For the mother  Breastfeeding helps develop a very special bond between mother and baby.   It is more convenient, always available at the  correct temperature and cheaper than formula feeding.   It burns calories in the mother and helps with losing weight that was gained during pregnancy.   It makes the uterus contract back down to normal size faster and slows bleeding following delivery.   Breastfeeding mothers have a lower risk of developing breast cancer.  NURSE FREQUENTLY  A healthy, full-term baby may breastfeed as often as every hour or space his or her feedings to every 3 hours.   How often to nurse will vary from baby to baby. Watch your baby for signs of hunger, not the clock.   Nurse as often as the baby requests, or when you feel the need to reduce the fullness of your breasts.   Awaken the baby if it has been 3 to 4 hours since the last feeding.   Frequent feeding will help the mother make more milk and will prevent problems like sore nipples and engorgement of the breasts.  BABY'S POSITION AT THE BREAST  Whether lying down or sitting, be sure that the baby's tummy is facing your tummy.   Support the breast with 4 fingers underneath the breast and the thumb above. Make sure your fingers are well away from the nipple and baby's mouth.   Stroke the baby's lips and cheek closest to the breast gently with your finger or nipple.   When the baby's mouth is open wide enough, place all of your nipple and as much of the dark area around the nipple as possible into your baby's mouth.   Pull the baby in close so the tip of the nose and the baby's cheeks touch the breast during the feeding.  FEEDINGS  The length of each feeding varies from baby to baby and from feeding to feeding.   The baby must suck about 2 to 3 minutes for your milk   to get to him or her. This is called a "let down." For this reason, allow the baby to feed on each breast as long as he or she wants. Your baby will end the feeding when he or she has received the right balance of nutrients.   To break the suction, put your finger into the corner of the  baby's mouth and slide it between his or her gums before removing your breast from his or her mouth. This will help prevent sore nipples.  REDUCING BREAST ENGORGEMENT  In the first week after your baby is born, you may experience signs of breast engorgement. When breasts are engorged, they feel heavy, warm, full, and may be tender to the touch. You can reduce engorgement if you:   Nurse frequently, every 2 to 3 hours. Mothers who breastfeed early and often have fewer problems with engorgement.   Place light ice packs on your breasts between feedings. This reduces swelling. Wrap the ice packs in a lightweight towel to protect your skin.   Apply moist hot packs to your breast for 5 to 10 minutes before each feeding. This increases circulation and helps the milk flow.   Gently massage your breast before and during the feeding.   Make sure that the baby empties at least one breast at every feeding before switching sides.   Use a breast pump to empty the breasts if your baby is sleepy or not nursing well. You may also want to pump if you are returning to work or or you feel you are getting engorged.   Avoid bottle feeds, pacifiers or supplemental feedings of water or juice in place of breastfeeding.   Be sure the baby is latched on and positioned properly while breastfeeding.   Prevent fatigue, stress, and anemia.   Wear a supportive bra, avoiding underwire styles.   Eat a balanced diet with enough fluids.  If you follow these suggestions, your engorgement should improve in 24 to 48 hours. If you are still experiencing difficulty, call your lactation consultant or caregiver. IS MY BABY GETTING ENOUGH MILK? Sometimes, mothers worry about whether their babies are getting enough milk. You can be assured that your baby is getting enough milk if:  The baby is actively sucking and you hear swallowing.   The baby nurses at least 8 to 12 times in a 24 hour time period. Nurse your baby until he or  she unlatches or falls asleep at the first breast (at least 10 to 20 minutes), then offer the second side.   The baby is wetting 5 to 6 disposable diapers (6 to 8 cloth diapers) in a 24 hour period by 5 to 6 days of age.   The baby is having at least 2 to 3 stools every 24 hours for the first few months. Breast milk is all the food your baby needs. It is not necessary for your baby to have water or formula. In fact, to help your breasts make more milk, it is best not to give your baby supplemental feedings during the early weeks.   The stool should be soft and yellow.   The baby should gain 4 to 7 ounces per week after he is 4 days old.  TAKE CARE OF YOURSELF Take care of your breasts by:  Bathing or showering daily.   Avoiding the use of soaps on your nipples.   Start feedings on your left breast at one feeding and on your right breast at the next feeding.     You will notice an increase in your milk supply 2 to 5 days after delivery. You may feel some discomfort from engorgement, which makes your breasts very firm and often tender. Engorgement "peaks" out within 24 to 48 hours. In the meantime, apply warm moist towels to your breasts for 5 to 10 minutes before feeding. Gentle massage and expression of some milk before feeding will soften your breasts, making it easier for your baby to latch on. Wear a well fitting nursing bra and air dry your nipples for 10 to 15 minutes after each feeding.   Only use cotton bra pads.   Only use pure lanolin on your nipples after nursing. You do not need to wash it off before nursing.  Take care of yourself by:   Eating well-balanced meals and nutritious snacks.   Drinking milk, fruit juice, and water to satisfy your thirst (about 8 glasses a day).   Getting plenty of rest.   Increasing calcium in your diet (1200 mg a day).   Avoiding foods that you notice affect the baby in a bad way.  SEEK MEDICAL CARE IF:   You have any questions or difficulty  with breastfeeding.   You need help.   You have a hard, red, sore area on your breast, accompanied by a fever of 100.5 F (38.1 C) or more.   Your baby is too sleepy to eat well or is having trouble sleeping.   Your baby is wetting less than 6 diapers per day, by 5 days of age.   Your baby's skin or white part of his or her eyes is more yellow than it was in the hospital.   You feel depressed.  Document Released: 01/05/2005 Document Revised: 09/17/2010 Document Reviewed: 08/20/2008 [employer] Patient Information 2012 [employer]. 

## 2011-03-20 ENCOUNTER — Ambulatory Visit (INDEPENDENT_AMBULATORY_CARE_PROVIDER_SITE_OTHER): Payer: Commercial Managed Care - PPO | Admitting: Physician Assistant

## 2011-03-20 DIAGNOSIS — Z3480 Encounter for supervision of other normal pregnancy, unspecified trimester: Secondary | ICD-10-CM

## 2011-03-20 NOTE — Progress Notes (Signed)
[id number] 

## 2011-03-25 ENCOUNTER — Ambulatory Visit (HOSPITAL_COMMUNITY)
Admission: RE | Admit: 2011-03-25 | Discharge: 2011-03-25 | Disposition: A | Discharge status: Home / Self Care | Payer: Commercial Managed Care - PPO | Source: Ambulatory Visit | Attending: Obstetrics & Gynecology | Admitting: Obstetrics & Gynecology

## 2011-03-25 DIAGNOSIS — Z3689 Encounter for other specified antenatal screening: Secondary | ICD-10-CM | POA: Insufficient documentation

## 2011-03-25 DIAGNOSIS — IMO0002 Reserved for concepts with insufficient information to code with codable children: Secondary | ICD-10-CM

## 2011-03-25 DIAGNOSIS — IMO0001 Reserved for inherently not codable concepts without codable children: Secondary | ICD-10-CM | POA: Insufficient documentation

## 2011-03-25 DIAGNOSIS — Z0489 Encounter for examination and observation for other specified reasons: Secondary | ICD-10-CM

## 2011-04-03 ENCOUNTER — Ambulatory Visit (INDEPENDENT_AMBULATORY_CARE_PROVIDER_SITE_OTHER): Payer: Commercial Managed Care - PPO | Admitting: Advanced Practice Midwife

## 2011-04-03 VITALS — BP 122/75 | Temp 98.3°F | Wt 199.0 lb

## 2011-04-03 DIAGNOSIS — Z3490 Encounter for supervision of normal pregnancy, unspecified, unspecified trimester: Secondary | ICD-10-CM

## 2011-04-03 DIAGNOSIS — Z3480 Encounter for supervision of other normal pregnancy, unspecified trimester: Secondary | ICD-10-CM

## 2011-04-03 MED ORDER — CETIRIZINE HCL 10 MG PO TABS: 10.0000 mg | ORAL_TABLET | Freq: Every day | ORAL | Status: DC

## 2011-04-03 NOTE — Progress Notes (Signed)
p-[id number] 

## 2011-04-03 NOTE — Progress Notes (Signed)
Doing well.  Has appt for growth but was told she may not need any more scans.  Appt is for April. Will coord. With MFM.

## 2011-04-03 NOTE — Patient Instructions (Signed)
Allergies, Generic Allergies may happen from anything your body is sensitive to. This may be food, medicines, pollens, chemicals, and nearly anything around you in everyday life that produces allergens. An allergen is anything that causes an allergy producing substance. Heredity is often a factor in causing these problems. This means you may have some of the same allergies as your parents. Food allergies happen in all age groups. Food allergies are some of the most severe and life threatening. Some common food allergies are cow's milk, seafood, eggs, nuts, wheat, and soybeans. SYMPTOMS   Swelling around the mouth.   An itchy red rash or hives.   Vomiting or diarrhea.   Difficulty breathing.  SEVERE ALLERGIC REACTIONS ARE LIFE-THREATENING. This reaction is called anaphylaxis. It can cause the mouth and throat to swell and cause difficulty with breathing and swallowing. In severe reactions only a trace amount of food (for example, peanut oil in a salad) may cause death within seconds. Seasonal allergies occur in all age groups. These are seasonal because they usually occur during the same season every year. They may be a reaction to molds, grass pollens, or tree pollens. Other causes of problems are house dust mite allergens, pet dander, and mold spores. The symptoms often consist of nasal congestion, a runny itchy nose associated with sneezing, and tearing itchy eyes. There is often an associated itching of the mouth and ears. The problems happen when you come in contact with pollens and other allergens. Allergens are the particles in the air that the body reacts to with an allergic reaction. This causes you to release allergic antibodies. Through a chain of events, these eventually cause you to release histamine into the blood stream. Although it is meant to be protective to the body, it is this release that causes your discomfort. This is why you were given anti-histamines to feel better. If you are  unable to pinpoint the offending allergen, it may be determined by skin or blood testing. Allergies cannot be cured but can be controlled with medicine. Hay fever is a collection of all or some of the seasonal allergy problems. It may often be treated with simple over-the-counter medicine such as diphenhydramine. Take medicine as directed. Do not drink alcohol or drive while taking this medicine. Check with your caregiver or package insert for child dosages. If these medicines are not effective, there are many new medicines your caregiver can prescribe. Stronger medicine such as nasal spray, eye drops, and corticosteroids may be used if the first things you try do not work well. Other treatments such as immunotherapy or desensitizing injections can be used if all else fails. Follow up with your caregiver if problems continue. These seasonal allergies are usually not life threatening. They are generally more of a nuisance that can often be handled using medicine. HOME CARE INSTRUCTIONS   If unsure what causes a reaction, keep a diary of foods eaten and symptoms that follow. Avoid foods that cause reactions.   If hives or rash are present:   Take medicine as directed.   You may use an over-the-counter antihistamine (diphenhydramine) for hives and itching as needed.   Apply cold compresses (cloths) to the skin or take baths in cool water. Avoid hot baths or showers. Heat will make a rash and itching worse.   If you are severely allergic:   Following a treatment for a severe reaction, hospitalization is often required for closer follow-up.   Wear a medic-alert bracelet or necklace stating the allergy.     You and your family must learn how to give adrenaline or use an anaphylaxis kit.   If you have had a severe reaction, always carry your anaphylaxis kit or EpiPen with you. Use this medicine as directed by your caregiver if a severe reaction is occurring. Failure to do so could have a fatal  outcome.  SEEK MEDICAL CARE IF:  You suspect a food allergy. Symptoms generally happen within 30 minutes of eating a food.   Your symptoms have not gone away within 2 days or are getting worse.   You develop new symptoms.   You want to retest yourself or your child with a food or drink you think causes an allergic reaction. Never do this if an anaphylactic reaction to that food or drink has happened before. Only do this under the care of a caregiver.  SEEK IMMEDIATE MEDICAL CARE IF:   You have difficulty breathing, are wheezing, or have a tight feeling in your chest or throat.   You have a swollen mouth, or you have hives, swelling, or itching all over your body.   You have had a severe reaction that has responded to your anaphylaxis kit or an EpiPen. These reactions may return when the medicine has worn off. These reactions should be considered life threatening.  MAKE SURE YOU:   Understand these instructions.   Will watch your condition.   Will get help right away if you are not doing well or get worse.  Document Released: 03/31/2002 Document Revised: 12/25/2010 Document Reviewed: 09/05/2007 [employer] Patient Information 2012 [employer]. 

## 2011-04-20 ENCOUNTER — Ambulatory Visit (INDEPENDENT_AMBULATORY_CARE_PROVIDER_SITE_OTHER): Payer: Commercial Managed Care - PPO | Admitting: Advanced Practice Midwife

## 2011-04-20 ENCOUNTER — Encounter: Payer: Self-pay | Admitting: Advanced Practice Midwife

## 2011-04-20 DIAGNOSIS — Z3480 Encounter for supervision of other normal pregnancy, unspecified trimester: Secondary | ICD-10-CM

## 2011-04-20 NOTE — Progress Notes (Signed)
[id number] 

## 2011-04-20 NOTE — Patient Instructions (Signed)
Pregnancy - Third Trimester The third trimester of pregnancy (the last 3 months) is a period of the most rapid growth for you and your baby. The baby approaches a length of 20 inches and a weight of 6 to 10 pounds. The baby is adding on fat and getting ready for life outside your body. While inside, babies have periods of sleeping and waking, suck their thumbs, and hiccups. You can often feel small contractions of the uterus. This is false labor. It is also called Braxton-Hicks contractions. This is like a practice for labor. The usual problems in this stage of pregnancy include more difficulty breathing, swelling of the hands and feet from water retention, and having to urinate more often because of the uterus and baby pressing on your bladder.  PRENATAL EXAMS  Blood work may continue to be done during prenatal exams. These tests are done to check on your health and the probable health of your baby. Blood work is used to follow your blood levels (hemoglobin). Anemia (low hemoglobin) is common during pregnancy. Iron and vitamins are given to help prevent this. You may also continue to be checked for diabetes. Some of the past blood tests may be done again.   The size of the uterus is measured during each visit. This makes sure your baby is growing properly according to your pregnancy dates.   Your blood pressure is checked every prenatal visit. This is to make sure you are not getting toxemia.   Your urine is checked every prenatal visit for infection, diabetes and protein.   Your weight is checked at each visit. This is done to make sure gains are happening at the suggested rate and that you and your baby are growing normally.   Sometimes, an ultrasound is performed to confirm the position and the proper growth and development of the baby. This is a test done that bounces harmless sound waves off the baby so your caregiver can more accurately determine due dates.   Discuss the type of pain  medication and anesthesia you will have during your labor and delivery.   Discuss the possibility and anesthesia if a Cesarean Section might be necessary.   Inform your caregiver if there is any mental or physical violence at home.  Sometimes, a specialized non-stress test, contraction stress test and biophysical profile are done to make sure the baby is not having a problem. Checking the amniotic fluid surrounding the baby is called an amniocentesis. The amniotic fluid is removed by sticking a needle into the belly (abdomen). This is sometimes done near the end of pregnancy if an early delivery is required. In this case, it is done to help make sure the baby's lungs are mature enough for the baby to live outside of the womb. If the lungs are not mature and it is unsafe to deliver the baby, an injection of cortisone medication is given to the mother 1 to 2 days before the delivery. This helps the baby's lungs mature and makes it safer to deliver the baby. CHANGES OCCURING IN THE THIRD TRIMESTER OF PREGNANCY Your body goes through many changes during pregnancy. They vary from person to person. Talk to your caregiver about changes you notice and are concerned about.  During the last trimester, you have probably had an increase in your appetite. It is normal to have cravings for certain foods. This varies from person to person and pregnancy to pregnancy.   You may begin to get stretch marks on your hips,   abdomen, and breasts. These are normal changes in the body during pregnancy. There are no exercises or medications to take which prevent this change.   Constipation may be treated with a stool softener or adding bulk to your diet. Drinking lots of fluids, fiber in vegetables, fruits, and whole grains are helpful.   Exercising is also helpful. If you have been very active up until your pregnancy, most of these activities can be continued during your pregnancy. If you have been less active, it is helpful  to start an exercise program such as walking. Consult your caregiver before starting exercise programs.   Avoid all smoking, alcohol, un-prescribed drugs, herbs and "street drugs" during your pregnancy. These chemicals affect the formation and growth of the baby. Avoid chemicals throughout the pregnancy to ensure the delivery of a healthy infant.   Backache, varicose veins and hemorrhoids may develop or get worse.   You will tire more easily in the third trimester, which is normal.   The baby's movements may be stronger and more often.   You may become short of breath easily.   Your belly button may stick out.   A yellow discharge may leak from your breasts called colostrum.   You may have a bloody mucus discharge. This usually occurs a few days to a week before labor begins.  HOME CARE INSTRUCTIONS   Keep your caregiver's appointments. Follow your caregiver's instructions regarding medication use, exercise, and diet.   During pregnancy, you are providing food for you and your baby. Continue to eat regular, well-balanced meals. Choose foods such as meat, fish, milk and other low fat dairy products, vegetables, fruits, and whole-grain breads and cereals. Your caregiver will tell you of the ideal weight gain.   A physical sexual relationship may be continued throughout pregnancy if there are no other problems such as early (premature) leaking of amniotic fluid from the membranes, vaginal bleeding, or belly (abdominal) pain.   Exercise regularly if there are no restrictions. Check with your caregiver if you are unsure of the safety of your exercises. Greater weight gain will occur in the last 2 trimesters of pregnancy. Exercising helps:   Control your weight.   Get you in shape for labor and delivery.   You lose weight after you deliver.   Rest a lot with legs elevated, or as needed for leg cramps or low back pain.   Wear a good support or jogging bra for breast tenderness during  pregnancy. This may help if worn during sleep. Pads or tissues may be used in the bra if you are leaking colostrum.   Do not use hot tubs, steam rooms, or saunas.   Wear your seat belt when driving. This protects you and your baby if you are in an accident.   Avoid raw meat, cat litter boxes and soil used by cats. These carry germs that can cause birth defects in the baby.   It is easier to loose urine during pregnancy. Tightening up and strengthening the pelvic muscles will help with this problem. You can practice stopping your urination while you are going to the bathroom. These are the same muscles you need to strengthen. It is also the muscles you would use if you were trying to stop from passing gas. You can practice tightening these muscles up 10 times a set and repeating this about 3 times per day. Once you know what muscles to tighten up, do not perform these exercises during urination. It is more likely   to cause an infection by backing up the urine.   Ask for help if you have financial, counseling or nutritional needs during pregnancy. Your caregiver will be able to offer counseling for these needs as well as refer you for other special needs.   Make a list of emergency phone numbers and have them available.   Plan on getting help from family or friends when you go home from the hospital.   Make a trial run to the hospital.   Take prenatal classes with the father to understand, practice and ask questions about the labor and delivery.   Prepare the baby's room/nursery.   Do not travel out of the city unless it is absolutely necessary and with the advice of your caregiver.   Wear only low or no heal shoes to have better balance and prevent falling.  MEDICATIONS AND DRUG USE IN PREGNANCY  Take prenatal vitamins as directed. The vitamin should contain 1 milligram of folic acid. Keep all vitamins out of reach of children. Only a couple vitamins or tablets containing iron may be fatal  to a baby or young child when ingested.   Avoid use of all medications, including herbs, over-the-counter medications, not prescribed or suggested by your caregiver. Only take over-the-counter or prescription medicines for pain, discomfort, or fever as directed by your caregiver. Do not use aspirin, ibuprofen (Motrin, Advil, Nuprin) or naproxen (Aleve) unless OK'd by your caregiver.   Let your caregiver also know about herbs you may be using.   Alcohol is related to a number of birth defects. This includes fetal alcohol syndrome. All alcohol, in any form, should be avoided completely. Smoking will cause low birth rate and premature babies.   Street/illegal drugs are very harmful to the baby. They are absolutely forbidden. A baby born to an addicted mother will be addicted at birth. The baby will go through the same withdrawal an adult does.  SEEK MEDICAL CARE IF: You have any concerns or worries during your pregnancy. It is better to call with your questions if you feel they cannot wait, rather than worry about them. DECISIONS ABOUT CIRCUMCISION You may or may not know the sex of your baby. If you know your baby is a boy, it may be time to think about circumcision. Circumcision is the removal of the foreskin of the penis. This is the skin that covers the sensitive end of the penis. There is no proven medical need for this. Often this decision is made on what is popular at the time or based upon religious beliefs and social issues. You can discuss these issues with your caregiver or pediatrician. SEEK IMMEDIATE MEDICAL CARE IF:   An unexplained oral temperature above 102 F (38.9 C) develops, or as your caregiver suggests.   You have leaking of fluid from the vagina (birth canal). If leaking membranes are suspected, take your temperature and tell your caregiver of this when you call.   There is vaginal spotting, bleeding or passing clots. Tell your caregiver of the amount and how many pads are  used.   You develop a bad smelling vaginal discharge with a change in the color from clear to white.   You develop vomiting that lasts more than 24 hours.   You develop chills or fever.   You develop shortness of breath.   You develop burning on urination.   You loose more than 2 pounds of weight or gain more than 2 pounds of weight or as suggested by your   caregiver.   You notice sudden swelling of your face, hands, and feet or legs.   You develop belly (abdominal) pain. Round ligament discomfort is a common non-cancerous (benign) cause of abdominal pain in pregnancy. Your caregiver still must evaluate you.   You develop a severe headache that does not go away.   You develop visual problems, blurred or double vision.   If you have not felt your baby move for more than 1 hour. If you think the baby is not moving as much as usual, eat something with sugar in it and lie down on your left side for an hour. The baby should move at least 4 to 5 times per hour. Call right away if your baby moves less than that.   You fall, are in a car accident or any kind of trauma.   There is mental or physical violence at home.  Document Released: 12/30/2000 Document Revised: 12/25/2010 Document Reviewed: 07/04/2008 [employer] Patient Information 2012 [employer]. Breastfeeding BENEFITS OF BREASTFEEDING For the baby  The first milk (colostrum) helps the baby's digestive system function better.   There are antibodies from the mother in the milk that help the baby fight off infections.   The baby has a lower incidence of asthma, allergies, and SIDS (sudden infant death syndrome).   The nutrients in breast milk are better than formulas for the baby and helps the baby's brain grow better.   Babies who breastfeed have less gas, colic, and constipation.  For the mother  Breastfeeding helps develop a very special bond between mother and baby.   It is more convenient, always available at the  correct temperature and cheaper than formula feeding.   It burns calories in the mother and helps with losing weight that was gained during pregnancy.   It makes the uterus contract back down to normal size faster and slows bleeding following delivery.   Breastfeeding mothers have a lower risk of developing breast cancer.  NURSE FREQUENTLY  A healthy, full-term baby may breastfeed as often as every hour or space his or her feedings to every 3 hours.   How often to nurse will vary from baby to baby. Watch your baby for signs of hunger, not the clock.   Nurse as often as the baby requests, or when you feel the need to reduce the fullness of your breasts.   Awaken the baby if it has been 3 to 4 hours since the last feeding.   Frequent feeding will help the mother make more milk and will prevent problems like sore nipples and engorgement of the breasts.  BABY'S POSITION AT THE BREAST  Whether lying down or sitting, be sure that the baby's tummy is facing your tummy.   Support the breast with 4 fingers underneath the breast and the thumb above. Make sure your fingers are well away from the nipple and baby's mouth.   Stroke the baby's lips and cheek closest to the breast gently with your finger or nipple.   When the baby's mouth is open wide enough, place all of your nipple and as much of the dark area around the nipple as possible into your baby's mouth.   Pull the baby in close so the tip of the nose and the baby's cheeks touch the breast during the feeding.  FEEDINGS  The length of each feeding varies from baby to baby and from feeding to feeding.   The baby must suck about 2 to 3 minutes for your milk   to get to him or her. This is called a "let down." For this reason, allow the baby to feed on each breast as long as he or she wants. Your baby will end the feeding when he or she has received the right balance of nutrients.   To break the suction, put your finger into the corner of the  baby's mouth and slide it between his or her gums before removing your breast from his or her mouth. This will help prevent sore nipples.  REDUCING BREAST ENGORGEMENT  In the first week after your baby is born, you may experience signs of breast engorgement. When breasts are engorged, they feel heavy, warm, full, and may be tender to the touch. You can reduce engorgement if you:   Nurse frequently, every 2 to 3 hours. Mothers who breastfeed early and often have fewer problems with engorgement.   Place light ice packs on your breasts between feedings. This reduces swelling. Wrap the ice packs in a lightweight towel to protect your skin.   Apply moist hot packs to your breast for 5 to 10 minutes before each feeding. This increases circulation and helps the milk flow.   Gently massage your breast before and during the feeding.   Make sure that the baby empties at least one breast at every feeding before switching sides.   Use a breast pump to empty the breasts if your baby is sleepy or not nursing well. You may also want to pump if you are returning to work or or you feel you are getting engorged.   Avoid bottle feeds, pacifiers or supplemental feedings of water or juice in place of breastfeeding.   Be sure the baby is latched on and positioned properly while breastfeeding.   Prevent fatigue, stress, and anemia.   Wear a supportive bra, avoiding underwire styles.   Eat a balanced diet with enough fluids.  If you follow these suggestions, your engorgement should improve in 24 to 48 hours. If you are still experiencing difficulty, call your lactation consultant or caregiver. IS MY BABY GETTING ENOUGH MILK? Sometimes, mothers worry about whether their babies are getting enough milk. You can be assured that your baby is getting enough milk if:  The baby is actively sucking and you hear swallowing.   The baby nurses at least 8 to 12 times in a 24 hour time period. Nurse your baby until he or  she unlatches or falls asleep at the first breast (at least 10 to 20 minutes), then offer the second side.   The baby is wetting 5 to 6 disposable diapers (6 to 8 cloth diapers) in a 24 hour period by 5 to 6 days of age.   The baby is having at least 2 to 3 stools every 24 hours for the first few months. Breast milk is all the food your baby needs. It is not necessary for your baby to have water or formula. In fact, to help your breasts make more milk, it is best not to give your baby supplemental feedings during the early weeks.   The stool should be soft and yellow.   The baby should gain 4 to 7 ounces per week after he is 4 days old.  TAKE CARE OF YOURSELF Take care of your breasts by:  Bathing or showering daily.   Avoiding the use of soaps on your nipples.   Start feedings on your left breast at one feeding and on your right breast at the next feeding.     You will notice an increase in your milk supply 2 to 5 days after delivery. You may feel some discomfort from engorgement, which makes your breasts very firm and often tender. Engorgement "peaks" out within 24 to 48 hours. In the meantime, apply warm moist towels to your breasts for 5 to 10 minutes before feeding. Gentle massage and expression of some milk before feeding will soften your breasts, making it easier for your baby to latch on. Wear a well fitting nursing bra and air dry your nipples for 10 to 15 minutes after each feeding.   Only use cotton bra pads.   Only use pure lanolin on your nipples after nursing. You do not need to wash it off before nursing.  Take care of yourself by:   Eating well-balanced meals and nutritious snacks.   Drinking milk, fruit juice, and water to satisfy your thirst (about 8 glasses a day).   Getting plenty of rest.   Increasing calcium in your diet (1200 mg a day).   Avoiding foods that you notice affect the baby in a bad way.  SEEK MEDICAL CARE IF:   You have any questions or difficulty  with breastfeeding.   You need help.   You have a hard, red, sore area on your breast, accompanied by a fever of 100.5 F (38.1 C) or more.   Your baby is too sleepy to eat well or is having trouble sleeping.   Your baby is wetting less than 6 diapers per day, by 5 days of age.   Your baby's skin or white part of his or her eyes is more yellow than it was in the hospital.   You feel depressed.  Document Released: 01/05/2005 Document Revised: 12/25/2010 Document Reviewed: 08/20/2008 [employer] Patient Information 2012 [employer]. 

## 2011-04-20 NOTE — Progress Notes (Signed)
Well, c/o some pain in hips, but no contractions or bleeding. Rev'd precautions. Discussed breastfeeding. Pt chose to cancel growth scan scheduled for this week, states she discussed with MFM and they did not feel strongly that she needed to continue with scans. GBS and GC at next visit.

## 2011-04-22 ENCOUNTER — Ambulatory Visit (HOSPITAL_COMMUNITY): Payer: Commercial Managed Care - PPO

## 2011-04-27 ENCOUNTER — Ambulatory Visit (INDEPENDENT_AMBULATORY_CARE_PROVIDER_SITE_OTHER): Payer: Commercial Managed Care - PPO | Admitting: Obstetrics and Gynecology

## 2011-04-27 VITALS — BP 120/74 | Temp 98.3°F | Wt 214.0 lb

## 2011-04-27 DIAGNOSIS — O359XX0 Maternal care for (suspected) fetal abnormality and damage, unspecified, not applicable or unspecified: Secondary | ICD-10-CM

## 2011-04-27 DIAGNOSIS — Z3400 Encounter for supervision of normal first pregnancy, unspecified trimester: Secondary | ICD-10-CM

## 2011-04-27 DIAGNOSIS — Q270 Congenital absence and hypoplasia of umbilical artery: Secondary | ICD-10-CM

## 2011-04-27 DIAGNOSIS — O358XX0 Maternal care for other (suspected) fetal abnormality and damage, not applicable or unspecified: Secondary | ICD-10-CM

## 2011-04-27 LAB — OB RESULTS CONSOLE GBS: GBS: POSITIVE

## 2011-04-27 NOTE — Patient Instructions (Signed)
Pregnancy - Third Trimester The third trimester of pregnancy (the last 3 months) is a period of the most rapid growth for you and your baby. The baby approaches a length of 20 inches and a weight of 6 to 10 pounds. The baby is adding on fat and getting ready for life outside your body. While inside, babies have periods of sleeping and waking, suck their thumbs, and hiccups. You can often feel small contractions of the uterus. This is false labor. It is also called Braxton-Hicks contractions. This is like a practice for labor. The usual problems in this stage of pregnancy include more difficulty breathing, swelling of the hands and feet from water retention, and having to urinate more often because of the uterus and baby pressing on your bladder.  PRENATAL EXAMS  Blood work may continue to be done during prenatal exams. These tests are done to check on your health and the probable health of your baby. Blood work is used to follow your blood levels (hemoglobin). Anemia (low hemoglobin) is common during pregnancy. Iron and vitamins are given to help prevent this. You may also continue to be checked for diabetes. Some of the past blood tests may be done again.   The size of the uterus is measured during each visit. This makes sure your baby is growing properly according to your pregnancy dates.   Your blood pressure is checked every prenatal visit. This is to make sure you are not getting toxemia.   Your urine is checked every prenatal visit for infection, diabetes and protein.   Your weight is checked at each visit. This is done to make sure gains are happening at the suggested rate and that you and your baby are growing normally.   Sometimes, an ultrasound is performed to confirm the position and the proper growth and development of the baby. This is a test done that bounces harmless sound waves off the baby so your caregiver can more accurately determine due dates.   Discuss the type of pain  medication and anesthesia you will have during your labor and delivery.   Discuss the possibility and anesthesia if a Cesarean Section might be necessary.   Inform your caregiver if there is any mental or physical violence at home.  Sometimes, a specialized non-stress test, contraction stress test and biophysical profile are done to make sure the baby is not having a problem. Checking the amniotic fluid surrounding the baby is called an amniocentesis. The amniotic fluid is removed by sticking a needle into the belly (abdomen). This is sometimes done near the end of pregnancy if an early delivery is required. In this case, it is done to help make sure the baby's lungs are mature enough for the baby to live outside of the womb. If the lungs are not mature and it is unsafe to deliver the baby, an injection of cortisone medication is given to the mother 1 to 2 days before the delivery. This helps the baby's lungs mature and makes it safer to deliver the baby. CHANGES OCCURING IN THE THIRD TRIMESTER OF PREGNANCY Your body goes through many changes during pregnancy. They vary from person to person. Talk to your caregiver about changes you notice and are concerned about.  During the last trimester, you have probably had an increase in your appetite. It is normal to have cravings for certain foods. This varies from person to person and pregnancy to pregnancy.   You may begin to get stretch marks on your hips,   abdomen, and breasts. These are normal changes in the body during pregnancy. There are no exercises or medications to take which prevent this change.   Constipation may be treated with a stool softener or adding bulk to your diet. Drinking lots of fluids, fiber in vegetables, fruits, and whole grains are helpful.   Exercising is also helpful. If you have been very active up until your pregnancy, most of these activities can be continued during your pregnancy. If you have been less active, it is helpful  to start an exercise program such as walking. Consult your caregiver before starting exercise programs.   Avoid all smoking, alcohol, un-prescribed drugs, herbs and "street drugs" during your pregnancy. These chemicals affect the formation and growth of the baby. Avoid chemicals throughout the pregnancy to ensure the delivery of a healthy infant.   Backache, varicose veins and hemorrhoids may develop or get worse.   You will tire more easily in the third trimester, which is normal.   The baby's movements may be stronger and more often.   You may become short of breath easily.   Your belly button may stick out.   A yellow discharge may leak from your breasts called colostrum.   You may have a bloody mucus discharge. This usually occurs a few days to a week before labor begins.  HOME CARE INSTRUCTIONS   Keep your caregiver's appointments. Follow your caregiver's instructions regarding medication use, exercise, and diet.   During pregnancy, you are providing food for you and your baby. Continue to eat regular, well-balanced meals. Choose foods such as meat, fish, milk and other low fat dairy products, vegetables, fruits, and whole-grain breads and cereals. Your caregiver will tell you of the ideal weight gain.   A physical sexual relationship may be continued throughout pregnancy if there are no other problems such as early (premature) leaking of amniotic fluid from the membranes, vaginal bleeding, or belly (abdominal) pain.   Exercise regularly if there are no restrictions. Check with your caregiver if you are unsure of the safety of your exercises. Greater weight gain will occur in the last 2 trimesters of pregnancy. Exercising helps:   Control your weight.   Get you in shape for labor and delivery.   You lose weight after you deliver.   Rest a lot with legs elevated, or as needed for leg cramps or low back pain.   Wear a good support or jogging bra for breast tenderness during  pregnancy. This may help if worn during sleep. Pads or tissues may be used in the bra if you are leaking colostrum.   Do not use hot tubs, steam rooms, or saunas.   Wear your seat belt when driving. This protects you and your baby if you are in an accident.   Avoid raw meat, cat litter boxes and soil used by cats. These carry germs that can cause birth defects in the baby.   It is easier to loose urine during pregnancy. Tightening up and strengthening the pelvic muscles will help with this problem. You can practice stopping your urination while you are going to the bathroom. These are the same muscles you need to strengthen. It is also the muscles you would use if you were trying to stop from passing gas. You can practice tightening these muscles up 10 times a set and repeating this about 3 times per day. Once you know what muscles to tighten up, do not perform these exercises during urination. It is more likely   to cause an infection by backing up the urine.   Ask for help if you have financial, counseling or nutritional needs during pregnancy. Your caregiver will be able to offer counseling for these needs as well as refer you for other special needs.   Make a list of emergency phone numbers and have them available.   Plan on getting help from family or friends when you go home from the hospital.   Make a trial run to the hospital.   Take prenatal classes with the father to understand, practice and ask questions about the labor and delivery.   Prepare the baby's room/nursery.   Do not travel out of the city unless it is absolutely necessary and with the advice of your caregiver.   Wear only low or no heal shoes to have better balance and prevent falling.  MEDICATIONS AND DRUG USE IN PREGNANCY  Take prenatal vitamins as directed. The vitamin should contain 1 milligram of folic acid. Keep all vitamins out of reach of children. Only a couple vitamins or tablets containing iron may be fatal  to a baby or young child when ingested.   Avoid use of all medications, including herbs, over-the-counter medications, not prescribed or suggested by your caregiver. Only take over-the-counter or prescription medicines for pain, discomfort, or fever as directed by your caregiver. Do not use aspirin, ibuprofen (Motrin, Advil, Nuprin) or naproxen (Aleve) unless OK'd by your caregiver.   Let your caregiver also know about herbs you may be using.   Alcohol is related to a number of birth defects. This includes fetal alcohol syndrome. All alcohol, in any form, should be avoided completely. Smoking will cause low birth rate and premature babies.   Street/illegal drugs are very harmful to the baby. They are absolutely forbidden. A baby born to an addicted mother will be addicted at birth. The baby will go through the same withdrawal an adult does.  SEEK MEDICAL CARE IF: You have any concerns or worries during your pregnancy. It is better to call with your questions if you feel they cannot wait, rather than worry about them. DECISIONS ABOUT CIRCUMCISION You may or may not know the sex of your baby. If you know your baby is a boy, it may be time to think about circumcision. Circumcision is the removal of the foreskin of the penis. This is the skin that covers the sensitive end of the penis. There is no proven medical need for this. Often this decision is made on what is popular at the time or based upon religious beliefs and social issues. You can discuss these issues with your caregiver or pediatrician. SEEK IMMEDIATE MEDICAL CARE IF:   An unexplained oral temperature above 102 F (38.9 C) develops, or as your caregiver suggests.   You have leaking of fluid from the vagina (birth canal). If leaking membranes are suspected, take your temperature and tell your caregiver of this when you call.   There is vaginal spotting, bleeding or passing clots. Tell your caregiver of the amount and how many pads are  used.   You develop a bad smelling vaginal discharge with a change in the color from clear to white.   You develop vomiting that lasts more than 24 hours.   You develop chills or fever.   You develop shortness of breath.   You develop burning on urination.   You loose more than 2 pounds of weight or gain more than 2 pounds of weight or as suggested by your   caregiver.   You notice sudden swelling of your face, hands, and feet or legs.   You develop belly (abdominal) pain. Round ligament discomfort is a common non-cancerous (benign) cause of abdominal pain in pregnancy. Your caregiver still must evaluate you.   You develop a severe headache that does not go away.   You develop visual problems, blurred or double vision.   If you have not felt your baby move for more than 1 hour. If you think the baby is not moving as much as usual, eat something with sugar in it and lie down on your left side for an hour. The baby should move at least 4 to 5 times per hour. Call right away if your baby moves less than that.   You fall, are in a car accident or any kind of trauma.   There is mental or physical violence at home.  Document Released: 12/30/2000 Document Revised: 12/25/2010 Document Reviewed: 07/04/2008 Exit[employer] Patient Information 2012 [employer]. 

## 2011-04-27 NOTE — Progress Notes (Signed)
p-69 

## 2011-04-27 NOTE — Progress Notes (Signed)
Cultures done. [place] for growth scheduled d/t SUA. Works Biochemist, clinical, doing well.

## 2011-04-28 LAB — GC/CHLAMYDIA PROBE AMP, GENITAL
Chlamydia, DNA Probe: NEGATIVE
GC Probe Amp, Genital: NEGATIVE

## 2011-05-01 LAB — CULTURE, BETA STREP (GROUP B ONLY)

## 2011-05-04 ENCOUNTER — Ambulatory Visit (INDEPENDENT_AMBULATORY_CARE_PROVIDER_SITE_OTHER): Payer: Commercial Managed Care - PPO | Admitting: Advanced Practice Midwife

## 2011-05-04 VITALS — BP 127/83 | Temp 98.5°F | Wt 216.0 lb

## 2011-05-04 DIAGNOSIS — Z3490 Encounter for supervision of normal pregnancy, unspecified, unspecified trimester: Secondary | ICD-10-CM

## 2011-05-04 DIAGNOSIS — Z3480 Encounter for supervision of other normal pregnancy, unspecified trimester: Secondary | ICD-10-CM

## 2011-05-04 NOTE — Patient Instructions (Signed)
Normal Labor and Delivery Your caregiver must first be sure you are in labor. Signs of labor include:  You may pass what is called "the mucus plug" before labor begins. This is a small amount of blood stained mucus.   Regular uterine contractions.   The time between contractions get closer together.   The discomfort and pain gradually gets more intense.   Pains are mostly located in the back.   Pains get worse when walking.   The cervix (the opening of the uterus becomes thinner (begins to efface) and opens up (dilates).  Once you are in labor and admitted into the hospital or care center, your caregiver will do the following:  A complete physical examination.   Check your vital signs (blood pressure, pulse, temperature and the fetal heart rate).   Do a vaginal examination (using a sterile glove and lubricant) to determine:   The position (presentation) of the baby (head [vertex] or buttock first).   The level (station) of the baby's head in the birth canal.   The effacement and dilatation of the cervix.   You may have your pubic hair shaved and be given an enema depending on your caregiver and the circumstance.   An electronic monitor is usually placed on your abdomen. The monitor follows the length and intensity of the contractions, as well as the baby's heart rate.   Usually, your caregiver will insert an IV in your arm with a bottle of sugar water. This is done as a precaution so that medications can be given to you quickly during labor or delivery.  NORMAL LABOR AND DELIVERY IS DIVIDED UP INTO 3 STAGES: First Stage This is when regular contractions begin and the cervix begins to efface and dilate. This stage can last from 3 to 15 hours. The end of the first stage is when the cervix is 100% effaced and 10 centimeters dilated. Pain medications may be given by   Injection (morphine, demerol, etc.)   Regional anesthesia (spinal, caudal or epidural, anesthetics given in  different locations of the spine). Paracervical pain medication may be given, which is an injection of and anesthetic on each side of the cervix.  A pregnant woman may request to have "Natural Childbirth" which is not to have any medications or anesthesia during her labor and delivery. Second Stage This is when the baby comes down through the birth canal (vagina) and is born. This can take 1 to 4 hours. As the baby's head comes down through the birth canal, you may feel like you are going to have a bowel movement. You will get the urge to bear down and push until the baby is delivered. As the baby's head is being delivered, the caregiver will decide if an episiotomy (a cut in the perineum and vagina area) is needed to prevent tearing of the tissue in this area. The episiotomy is sewn up after the delivery of the baby and placenta. Sometimes a mask with nitrous oxide is given for the mother to breath during the delivery of the baby to help if there is too much pain. The end of Stage 2 is when the baby is fully delivered. Then when the umbilical cord stops pulsating it is clamped and cut. Third Stage The third stage begins after the baby is completely delivered and ends after the placenta (afterbirth) is delivered. This usually takes 5 to 30 minutes. After the placenta is delivered, a medication is given either by intravenous or injection to help contract   the uterus and prevent bleeding. The third stage is not painful and pain medication is usually not necessary. If an episiotomy was done, it is repaired at this time. After the delivery, the mother is watched and monitored closely for 1 to 2 hours to make sure there is no postpartum bleeding (hemorrhage). If there is a lot of bleeding, medication is given to contract the uterus and stop the bleeding. Document Released: 10/15/2007 Document Revised: 12/25/2010 Document Reviewed: 10/15/2007 [employer] Patient Information 2012 [employer]. 

## 2011-05-04 NOTE — Progress Notes (Signed)
GBS +, sensitive to PCN and Amp.  Did classes. Labor signs reviewed.

## 2011-05-04 NOTE — Progress Notes (Signed)
p-67  Blood large

## 2011-05-11 ENCOUNTER — Ambulatory Visit (INDEPENDENT_AMBULATORY_CARE_PROVIDER_SITE_OTHER): Payer: Commercial Managed Care - PPO | Admitting: Advanced Practice Midwife

## 2011-05-11 DIAGNOSIS — Z3480 Encounter for supervision of other normal pregnancy, unspecified trimester: Secondary | ICD-10-CM

## 2011-05-11 NOTE — Progress Notes (Signed)
p-77 

## 2011-05-11 NOTE — Patient Instructions (Signed)
Pregnancy - Third Trimester The third trimester of pregnancy (the last 3 months) is a period of the most rapid growth for you and your baby. The baby approaches a length of 20 inches and a weight of 6 to 10 pounds. The baby is adding on fat and getting ready for life outside your body. While inside, babies have periods of sleeping and waking, suck their thumbs, and hiccups. You can often feel small contractions of the uterus. This is false labor. It is also called Braxton-Hicks contractions. This is like a practice for labor. The usual problems in this stage of pregnancy include more difficulty breathing, swelling of the hands and feet from water retention, and having to urinate more often because of the uterus and baby pressing on your bladder.  PRENATAL EXAMS  Blood work may continue to be done during prenatal exams. These tests are done to check on your health and the probable health of your baby. Blood work is used to follow your blood levels (hemoglobin). Anemia (low hemoglobin) is common during pregnancy. Iron and vitamins are given to help prevent this. You may also continue to be checked for diabetes. Some of the past blood tests may be done again.   The size of the uterus is measured during each visit. This makes sure your baby is growing properly according to your pregnancy dates.   Your blood pressure is checked every prenatal visit. This is to make sure you are not getting toxemia.   Your urine is checked every prenatal visit for infection, diabetes and protein.   Your weight is checked at each visit. This is done to make sure gains are happening at the suggested rate and that you and your baby are growing normally.   Sometimes, an ultrasound is performed to confirm the position and the proper growth and development of the baby. This is a test done that bounces harmless sound waves off the baby so your caregiver can more accurately determine due dates.   Discuss the type of pain  medication and anesthesia you will have during your labor and delivery.   Discuss the possibility and anesthesia if a Cesarean Section might be necessary.   Inform your caregiver if there is any mental or physical violence at home.  Sometimes, a specialized non-stress test, contraction stress test and biophysical profile are done to make sure the baby is not having a problem. Checking the amniotic fluid surrounding the baby is called an amniocentesis. The amniotic fluid is removed by sticking a needle into the belly (abdomen). This is sometimes done near the end of pregnancy if an early delivery is required. In this case, it is done to help make sure the baby's lungs are mature enough for the baby to live outside of the womb. If the lungs are not mature and it is unsafe to deliver the baby, an injection of cortisone medication is given to the mother 1 to 2 days before the delivery. This helps the baby's lungs mature and makes it safer to deliver the baby. CHANGES OCCURING IN THE THIRD TRIMESTER OF PREGNANCY Your body goes through many changes during pregnancy. They vary from person to person. Talk to your caregiver about changes you notice and are concerned about.  During the last trimester, you have probably had an increase in your appetite. It is normal to have cravings for certain foods. This varies from person to person and pregnancy to pregnancy.   You may begin to get stretch marks on your hips,   abdomen, and breasts. These are normal changes in the body during pregnancy. There are no exercises or medications to take which prevent this change.   Constipation may be treated with a stool softener or adding bulk to your diet. Drinking lots of fluids, fiber in vegetables, fruits, and whole grains are helpful.   Exercising is also helpful. If you have been very active up until your pregnancy, most of these activities can be continued during your pregnancy. If you have been less active, it is helpful  to start an exercise program such as walking. Consult your caregiver before starting exercise programs.   Avoid all smoking, alcohol, un-prescribed drugs, herbs and "street drugs" during your pregnancy. These chemicals affect the formation and growth of the baby. Avoid chemicals throughout the pregnancy to ensure the delivery of a healthy infant.   Backache, varicose veins and hemorrhoids may develop or get worse.   You will tire more easily in the third trimester, which is normal.   The baby's movements may be stronger and more often.   You may become short of breath easily.   Your belly button may stick out.   A yellow discharge may leak from your breasts called colostrum.   You may have a bloody mucus discharge. This usually occurs a few days to a week before labor begins.  HOME CARE INSTRUCTIONS   Keep your caregiver's appointments. Follow your caregiver's instructions regarding medication use, exercise, and diet.   During pregnancy, you are providing food for you and your baby. Continue to eat regular, well-balanced meals. Choose foods such as meat, fish, milk and other low fat dairy products, vegetables, fruits, and whole-grain breads and cereals. Your caregiver will tell you of the ideal weight gain.   A physical sexual relationship may be continued throughout pregnancy if there are no other problems such as early (premature) leaking of amniotic fluid from the membranes, vaginal bleeding, or belly (abdominal) pain.   Exercise regularly if there are no restrictions. Check with your caregiver if you are unsure of the safety of your exercises. Greater weight gain will occur in the last 2 trimesters of pregnancy. Exercising helps:   Control your weight.   Get you in shape for labor and delivery.   You lose weight after you deliver.   Rest a lot with legs elevated, or as needed for leg cramps or low back pain.   Wear a good support or jogging bra for breast tenderness during  pregnancy. This may help if worn during sleep. Pads or tissues may be used in the bra if you are leaking colostrum.   Do not use hot tubs, steam rooms, or saunas.   Wear your seat belt when driving. This protects you and your baby if you are in an accident.   Avoid raw meat, cat litter boxes and soil used by cats. These carry germs that can cause birth defects in the baby.   It is easier to loose urine during pregnancy. Tightening up and strengthening the pelvic muscles will help with this problem. You can practice stopping your urination while you are going to the bathroom. These are the same muscles you need to strengthen. It is also the muscles you would use if you were trying to stop from passing gas. You can practice tightening these muscles up 10 times a set and repeating this about 3 times per day. Once you know what muscles to tighten up, do not perform these exercises during urination. It is more likely   to cause an infection by backing up the urine.   Ask for help if you have financial, counseling or nutritional needs during pregnancy. Your caregiver will be able to offer counseling for these needs as well as refer you for other special needs.   Make a list of emergency phone numbers and have them available.   Plan on getting help from family or friends when you go home from the hospital.   Make a trial run to the hospital.   Take prenatal classes with the father to understand, practice and ask questions about the labor and delivery.   Prepare the baby's room/nursery.   Do not travel out of the city unless it is absolutely necessary and with the advice of your caregiver.   Wear only low or no heal shoes to have better balance and prevent falling.  MEDICATIONS AND DRUG USE IN PREGNANCY  Take prenatal vitamins as directed. The vitamin should contain 1 milligram of folic acid. Keep all vitamins out of reach of children. Only a couple vitamins or tablets containing iron may be fatal  to a baby or young child when ingested.   Avoid use of all medications, including herbs, over-the-counter medications, not prescribed or suggested by your caregiver. Only take over-the-counter or prescription medicines for pain, discomfort, or fever as directed by your caregiver. Do not use aspirin, ibuprofen (Motrin, Advil, Nuprin) or naproxen (Aleve) unless OK'd by your caregiver.   Let your caregiver also know about herbs you may be using.   Alcohol is related to a number of birth defects. This includes fetal alcohol syndrome. All alcohol, in any form, should be avoided completely. Smoking will cause low birth rate and premature babies.   Street/illegal drugs are very harmful to the baby. They are absolutely forbidden. A baby born to an addicted mother will be addicted at birth. The baby will go through the same withdrawal an adult does.  SEEK MEDICAL CARE IF: You have any concerns or worries during your pregnancy. It is better to call with your questions if you feel they cannot wait, rather than worry about them. DECISIONS ABOUT CIRCUMCISION You may or may not know the sex of your baby. If you know your baby is a boy, it may be time to think about circumcision. Circumcision is the removal of the foreskin of the penis. This is the skin that covers the sensitive end of the penis. There is no proven medical need for this. Often this decision is made on what is popular at the time or based upon religious beliefs and social issues. You can discuss these issues with your caregiver or pediatrician. SEEK IMMEDIATE MEDICAL CARE IF:   An unexplained oral temperature above 102 F (38.9 C) develops, or as your caregiver suggests.   You have leaking of fluid from the vagina (birth canal). If leaking membranes are suspected, take your temperature and tell your caregiver of this when you call.   There is vaginal spotting, bleeding or passing clots. Tell your caregiver of the amount and how many pads are  used.   You develop a bad smelling vaginal discharge with a change in the color from clear to white.   You develop vomiting that lasts more than 24 hours.   You develop chills or fever.   You develop shortness of breath.   You develop burning on urination.   You loose more than 2 pounds of weight or gain more than 2 pounds of weight or as suggested by your   caregiver.   You notice sudden swelling of your face, hands, and feet or legs.   You develop belly (abdominal) pain. Round ligament discomfort is a common non-cancerous (benign) cause of abdominal pain in pregnancy. Your caregiver still must evaluate you.   You develop a severe headache that does not go away.   You develop visual problems, blurred or double vision.   If you have not felt your baby move for more than 1 hour. If you think the baby is not moving as much as usual, eat something with sugar in it and lie down on your left side for an hour. The baby should move at least 4 to 5 times per hour. Call right away if your baby moves less than that.   You fall, are in a car accident or any kind of trauma.   There is mental or physical violence at home.  Document Released: 12/30/2000 Document Revised: 12/25/2010 Document Reviewed: 07/04/2008 Exit[employer] Patient Information 2012 [employer]. 

## 2011-05-11 NOTE — Progress Notes (Signed)
Well, ready for baby. Rev'd when to call/come in.

## 2011-05-18 ENCOUNTER — Inpatient Hospital Stay (HOSPITAL_COMMUNITY)
Admission: AD | Admit: 2011-05-18 | Discharge: 2011-05-24 | DRG: 766 | Disposition: A | Discharge status: Home / Self Care | Payer: Commercial Managed Care - PPO | Source: Ambulatory Visit | Attending: Obstetrics & Gynecology | Admitting: Obstetrics & Gynecology

## 2011-05-18 ENCOUNTER — Encounter: Payer: Self-pay | Admitting: Obstetrics and Gynecology

## 2011-05-18 ENCOUNTER — Ambulatory Visit (INDEPENDENT_AMBULATORY_CARE_PROVIDER_SITE_OTHER): Payer: Commercial Managed Care - PPO | Admitting: Obstetrics and Gynecology

## 2011-05-18 ENCOUNTER — Encounter (HOSPITAL_COMMUNITY): Payer: Self-pay | Admitting: *Deleted

## 2011-05-18 VITALS — BP 131/83 | Temp 98.3°F | Wt 221.0 lb

## 2011-05-18 DIAGNOSIS — O359XX0 Maternal care for (suspected) fetal abnormality and damage, unspecified, not applicable or unspecified: Secondary | ICD-10-CM

## 2011-05-18 DIAGNOSIS — Q270 Congenital absence and hypoplasia of umbilical artery: Secondary | ICD-10-CM

## 2011-05-18 DIAGNOSIS — IMO0002 Reserved for concepts with insufficient information to code with codable children: Secondary | ICD-10-CM

## 2011-05-18 DIAGNOSIS — O339 Maternal care for disproportion, unspecified: Secondary | ICD-10-CM | POA: Diagnosis present

## 2011-05-18 DIAGNOSIS — O330 Maternal care for disproportion due to deformity of maternal pelvic bones: Secondary | ICD-10-CM

## 2011-05-18 DIAGNOSIS — Z22330 Carrier of Group B streptococcus: Secondary | ICD-10-CM

## 2011-05-18 DIAGNOSIS — Z3490 Encounter for supervision of normal pregnancy, unspecified, unspecified trimester: Secondary | ICD-10-CM

## 2011-05-18 DIAGNOSIS — O99892 Other specified diseases and conditions complicating childbirth: Secondary | ICD-10-CM | POA: Diagnosis present

## 2011-05-18 DIAGNOSIS — IMO0001 Reserved for inherently not codable concepts without codable children: Secondary | ICD-10-CM

## 2011-05-18 DIAGNOSIS — R030 Elevated blood-pressure reading, without diagnosis of hypertension: Secondary | ICD-10-CM

## 2011-05-18 DIAGNOSIS — L708 Other acne: Secondary | ICD-10-CM

## 2011-05-18 DIAGNOSIS — Z3480 Encounter for supervision of other normal pregnancy, unspecified trimester: Secondary | ICD-10-CM

## 2011-05-18 DIAGNOSIS — R635 Abnormal weight gain: Secondary | ICD-10-CM

## 2011-05-18 LAB — COMPREHENSIVE METABOLIC PANEL
ALT: 12 U/L (ref 0–35)
AST: 23 U/L (ref 0–37)
Albumin: 2.8 g/dL — ABNORMAL LOW (ref 3.5–5.2)
Alkaline Phosphatase: 159 U/L — ABNORMAL HIGH (ref 39–117)
BUN: 8 mg/dL (ref 6–23)
CO2: 24 mEq/L (ref 19–32)
Calcium: 9.4 mg/dL (ref 8.4–10.5)
Chloride: 100 mEq/L (ref 96–112)
Creatinine, Ser: 0.69 mg/dL (ref 0.50–1.10)
GFR calc Af Amer: >90 mL/min (ref >90)
GFR calc non Af Amer: >90 mL/min (ref >90)
Glucose, Bld: 80 mg/dL (ref 70–99)
Potassium: 4.3 mEq/L (ref 3.5–5.1)
Sodium: 135 mEq/L (ref 135–145)
Total Bilirubin: 0.2 mg/dL — ABNORMAL LOW (ref 0.3–1.2)
Total Protein: 6.8 g/dL (ref 6.0–8.3)

## 2011-05-18 LAB — CBC
HCT: 36.2 % (ref 36.0–46.0)
HCT: 35.1 % — ABNORMAL LOW (ref 36.0–46.0)
Hemoglobin: 11.7 g/dL — ABNORMAL LOW (ref 12.0–15.0)
Hemoglobin: 11.4 g/dL — ABNORMAL LOW (ref 12.0–15.0)
MCH: 27.9 pg (ref 26.0–34.0)
MCH: 27.7 pg (ref 26.0–34.0)
MCHC: 32.3 g/dL (ref 30.0–36.0)
MCHC: 32.5 g/dL (ref 30.0–36.0)
MCV: 86.4 fL (ref 78.0–100.0)
MCV: 85.2 fL (ref 78.0–100.0)
Platelets: 238 10*3/uL (ref 150–400)
Platelets: 240 10*3/uL (ref 150–400)
RBC: 4.19 MIL/uL (ref 3.87–5.11)
RBC: 4.12 MIL/uL (ref 3.87–5.11)
RDW: 13.8 % (ref 11.5–15.5)
RDW: 13.9 % (ref 11.5–15.5)
WBC: 8.7 10*3/uL (ref 4.0–10.5)
WBC: 9.6 10*3/uL (ref 4.0–10.5)

## 2011-05-18 LAB — PROTEIN / CREATININE RATIO, URINE
Creatinine, Urine: 45.25 mg/dL
Protein Creatinine Ratio: 0.39 — ABNORMAL HIGH (ref 0.00–0.15)
Total Protein, Urine: 17.8 mg/dL

## 2011-05-18 MED ORDER — TERBUTALINE SULFATE 1 MG/ML IJ SOLN: 0.2500 mg | Freq: Once | INTRAMUSCULAR | Status: AC | PRN

## 2011-05-18 MED ORDER — FLEET ENEMA 7-19 GM/118ML RE ENEM: 1.0000 | ENEMA | RECTAL | Status: DC | PRN

## 2011-05-18 MED ORDER — MAGNESIUM SULFATE BOLUS VIA INFUSION
6.0000 g | Freq: Once | INTRAVENOUS | Status: DC
  Filled 2011-05-18: qty 500

## 2011-05-18 MED ORDER — PENICILLIN G POTASSIUM 5000000 UNITS IJ SOLR
5.0000 10*6.[IU] | Freq: Once | INTRAVENOUS | Status: AC
  Administered 2011-05-18: 5 10*6.[IU] via INTRAVENOUS
  Filled 2011-05-18: qty 5

## 2011-05-18 MED ORDER — ONDANSETRON HCL 4 MG/2ML IJ SOLN: 4.0000 mg | Freq: Four times a day (QID) | INTRAMUSCULAR | Status: DC | PRN

## 2011-05-18 MED ORDER — MAGNESIUM SULFATE 40 G IN LACTATED RINGERS - SIMPLE: 1.0000 g/h | INTRAVENOUS | Status: DC

## 2011-05-18 MED ORDER — NALBUPHINE SYRINGE 5 MG/0.5 ML
10.0000 mg | INJECTION | INTRAMUSCULAR | Status: DC | PRN
  Administered 2011-05-18 – 2011-05-19 (×3): 10 mg via INTRAVENOUS
  Filled 2011-05-18 (×4): qty 1

## 2011-05-18 MED ORDER — OXYTOCIN 20 UNITS IN LACTATED RINGERS INFUSION - SIMPLE: 125.0000 mL/h | Freq: Once | INTRAVENOUS | Status: DC

## 2011-05-18 MED ORDER — MISOPROSTOL 25 MCG QUARTER TABLET
25.0000 ug | ORAL_TABLET | ORAL | Status: DC | PRN
  Administered 2011-05-18: 25 ug via VAGINAL
  Filled 2011-05-18 (×2): qty 0.25

## 2011-05-18 MED ORDER — MAGNESIUM SULFATE 40 G IN LACTATED RINGERS - SIMPLE
2.0000 g/h | INTRAVENOUS | Status: DC
  Administered 2011-05-18 – 2011-05-20 (×3): 2 g/h via INTRAVENOUS
  Filled 2011-05-18 (×3): qty 500

## 2011-05-18 MED ORDER — LIDOCAINE HCL (PF) 1 % IJ SOLN: 30.0000 mL | INTRAMUSCULAR | Status: DC | PRN

## 2011-05-18 MED ORDER — OXYTOCIN BOLUS FROM INFUSION
500.0000 mL | Freq: Once | INTRAVENOUS | Status: DC
  Filled 2011-05-18: qty 500
  Filled 2011-05-18: qty 1000

## 2011-05-18 MED ORDER — NALBUPHINE HCL 10 MG/ML IJ SOLN: 10.0000 mg | INTRAMUSCULAR | Status: DC | PRN

## 2011-05-18 MED ORDER — LACTATED RINGERS IV SOLN
INTRAVENOUS | Status: DC
  Administered 2011-05-18 – 2011-05-20 (×9): via INTRAVENOUS
  Administered 2011-05-20: 950 mL/h via INTRAVENOUS
  Administered 2011-05-20 – 2011-05-21 (×2): via INTRAVENOUS

## 2011-05-18 MED ORDER — CITRIC ACID-SODIUM CITRATE 334-500 MG/5ML PO SOLN
30.0000 mL | ORAL | Status: DC | PRN
  Administered 2011-05-21: 30 mL via ORAL
  Filled 2011-05-18: qty 15

## 2011-05-18 MED ORDER — PENICILLIN G POTASSIUM 5000000 UNITS IJ SOLR
2.5000 10*6.[IU] | INTRAVENOUS | Status: DC
  Administered 2011-05-18 – 2011-05-20 (×14): 2.5 10*6.[IU] via INTRAVENOUS
  Filled 2011-05-18 (×18): qty 2.5

## 2011-05-18 MED ORDER — LACTATED RINGERS IV SOLN: 500.0000 mL | INTRAVENOUS | Status: DC | PRN

## 2011-05-18 MED ORDER — ACETAMINOPHEN 325 MG PO TABS
650.0000 mg | ORAL_TABLET | ORAL | Status: DC | PRN
  Administered 2011-05-20: 650 mg via ORAL
  Filled 2011-05-18: qty 2

## 2011-05-18 MED ORDER — OXYCODONE-ACETAMINOPHEN 5-325 MG PO TABS: 1.0000 | ORAL_TABLET | ORAL | Status: DC | PRN

## 2011-05-18 MED ORDER — IBUPROFEN 600 MG PO TABS: 600.0000 mg | ORAL_TABLET | Freq: Four times a day (QID) | ORAL | Status: DC | PRN

## 2011-05-18 NOTE — Progress Notes (Addendum)
Erin Stone is a 26 y.o. G1P0000 at [redacted]w[redacted]d   Subjective: Doing Well. Contractions stronger and more painful per pt.    Objective: BP 136/92  Pulse 81  Temp(Src) 98.5 F (36.9 C) (Oral)  Resp 20  Ht 5' 4.5" (1.638 m)  Wt 100.517 kg (221 lb 9.6 oz)  BMI 37.45 kg/m2  SpO2 100%  LMP 08/07/2010 I/O last 3 completed shifts: In: 1410 [P.O.:480; I.V.:680; IV Piggyback:250] Out: 200 [Urine:200]    FHT:  FHR: 130s bpm, variability: moderate,  accelerations:  Present,  decelerations:  Absent UC:   regular, every 2-3 minutes SVE:   Dilation: 1.5 Effacement (%): 40 Station: -2 Exam by:: Dr. Konrad Dolores  Labs: Lab Results  Component Value Date   WBC 9.6 05/18/2011   HGB 11.4* 05/18/2011   HCT 35.1* 05/18/2011   MCV 85.2 05/18/2011   PLT 240 05/18/2011    Assessment / Plan: Induction of labor due to preeclampsia,  progressing well. Foley Bulb placed  Labor: Progressing normally Preeclampsia:  on magnesium sulfate and no signs or symptoms of toxicity Fetal Wellbeing:  Category I Pain Control:  Labor support without medications I/D:  PCN Anticipated MOD:  NSVD  Erin Stone 05/18/2011, 7:25 PM

## 2011-05-18 NOTE — MAU Note (Signed)
Patient states she was sent from the office for evaluation of elevated blood pressure. Denies any headache, contractions, leaking or bleeding and reports good fetal movement.

## 2011-05-18 NOTE — Progress Notes (Signed)
Denies H/a or visual disturbance. Good FM. Occ mild UC.  Has hand edema and face puffy. BP recheck 140/92. DTRs 1-2+. D/W Dr. Penne Lash - > to [facility] for preE eval.

## 2011-05-18 NOTE — MAU Note (Signed)
Pt sent over from office for pih eval.  Denies any ha, dizziness, blurred vision or epigastric pain.  Denies any bleeding or lof.  + FM.

## 2011-05-18 NOTE — Patient Instructions (Signed)

## 2011-05-18 NOTE — Progress Notes (Signed)
Erin Stone is a 26 y.o. G1P0000 at [redacted]w[redacted]d   Subjective: Doing well. No pain complaints  Objective: BP 124/78  Pulse 95  Temp(Src) 98.2 F (36.8 C) (Oral)  Resp 20  Ht 5' 4.5" (1.638 m)  Wt 100.517 kg (221 lb 9.6 oz)  BMI 37.45 kg/m2  SpO2 100%  LMP 08/07/2010      FHT:  FHR: 140s bpm, variability: moderate,  accelerations:  Present,  decelerations:  Absent UC:   regular, every 3-4 minutes SVE:   Dilation: 1 Effacement (%): 30 Station: -3 Exam by:: Dr. Margot Ables  Labs: Lab Results  Component Value Date   WBC 9.6 05/18/2011   HGB 11.4* 05/18/2011   HCT 35.1* 05/18/2011   MCV 85.2 05/18/2011   PLT 240 05/18/2011    Assessment / Plan: Induction of labor due to preeclampsia,  progressing well on cytotec  Labor: Progressing normally Preeclampsia:  on magnesium sulfate and labs stable Fetal Wellbeing:  Category I Pain Control:  Labor support without medications I/D:  PCN for GBS + Anticipated MOD:  NSVD  Ashwin Tibbs 05/18/2011, 3:22 PM

## 2011-05-18 NOTE — H&P (Signed)
Erin Stone is a 26 y.o. female presenting for Preeclampsia. Maternal Medical History:  Reason for admission: Reason for Admission:   nauseaPreeclampsia  Contractions: Frequency: irregular.   Perceived severity is mild.    Fetal activity: Perceived fetal activity is normal.   Last perceived fetal movement was within the past hour.    Prenatal complications: Pre-eclampsia.   2 vessel cord  Prenatal Complications - Diabetes: none.    OB History    Grav Para Term Preterm Abortions TAB SAB Ect Mult Living   1 0 0 0 0 0 0 0 0 0      Past Medical History  Diagnosis Date  . PCOS (polycystic ovarian syndrome)    Past Surgical History  Procedure Date  . Anterior cruciate ligament repair 2002   Family History: family history is negative for Anesthesia problems. Social History:  reports that she has never smoked. She has never used smokeless tobacco. She reports that she drinks alcohol. She reports that she does not use illicit drugs.  Review of Systems  Constitutional: Negative for fever and chills.  Eyes: Negative for blurred vision and double vision.  Respiratory: Negative for shortness of breath.   Cardiovascular: Negative for chest pain.  Gastrointestinal: Positive for diarrhea. Negative for heartburn, nausea, vomiting, abdominal pain, constipation and blood in stool.  Genitourinary: Negative for dysuria, urgency, frequency and hematuria.  Neurological: Negative for dizziness, sensory change and headaches.      Blood pressure 133/85, pulse 86, temperature 98 F (36.7 C), temperature source Oral, resp. rate 18, height 5' 4.5" (1.638 m), weight 100.517 kg (221 lb 9.6 oz), last menstrual period 08/07/2010, SpO2 100.00%. Maternal Exam:  Uterine Assessment: Contraction strength is mild.  Contraction frequency is irregular.   Abdomen: Fundal height is Term.   Fetal presentation: vertex  Introitus: Normal vulva. Normal vagina.  Vagina is negative for discharge.  Ferning test:  not done.  Nitrazine test: not done.  Pelvis: adequate for delivery.   Cervix: Cervix evaluated by digital exam.     Physical Exam  Constitutional: She is oriented to person, place, and time. She appears well-developed and well-nourished.  Eyes: EOM are normal.  Neck: Normal range of motion.  Cardiovascular: Normal rate and regular rhythm.   Respiratory: Effort normal.  GI: Soft. There is no tenderness. There is no rebound and no guarding.  Genitourinary: Vagina normal and uterus normal. No vaginal discharge found.  Musculoskeletal: Normal range of motion.  Neurological: She is alert and oriented to person, place, and time.  Skin: Skin is warm and dry.    Prenatal labs: ABO, Rh: A/POS/-- (09/11 0944) Antibody: NEG (09/11 0944) Rubella: 6.9 (09/11 0944) RPR: NON REAC (01/30 0914)  HBsAg: NEGATIVE (09/11 0944)  HIV: NON REACTIVE (01/30 0914)  GBS:   Positive  Results for orders placed during the hospital encounter of 05/18/11 (from the past 24 hour(s))  PROTEIN / CREATININE RATIO, URINE     Status: Abnormal   Collection Time   05/18/11 10:00 AM      Component Value Range   Creatinine, Urine 45.25     Total Protein, Urine 17.8     PROTEIN CREATININE RATIO 0.39 (*) 0.00 - 0.15   COMPREHENSIVE METABOLIC PANEL     Status: Abnormal   Collection Time   05/18/11 10:05 AM      Component Value Range   Sodium 135  135 - 145 (mEq/L)   Potassium 4.3  3.5 - 5.1 (mEq/L)   Chloride 100  96 -  112 (mEq/L)   CO2 24  19 - 32 (mEq/L)   Glucose, Bld 80  70 - 99 (mg/dL)   BUN 8  6 - 23 (mg/dL)   Creatinine, Ser 9.60  0.50 - 1.10 (mg/dL)   Calcium 9.4  8.4 - 45.4 (mg/dL)   Total Protein 6.8  6.0 - 8.3 (g/dL)   Albumin 2.8 (*) 3.5 - 5.2 (g/dL)   AST 23  0 - 37 (U/L)   ALT 12  0 - 35 (U/L)   Alkaline Phosphatase 159 (*) 39 - 117 (U/L)   Total Bilirubin 0.2 (*) 0.3 - 1.2 (mg/dL)   GFR calc non Af Amer >90  >90 (mL/min)   GFR calc Af Amer >90  >90 (mL/min)  CBC     Status: Abnormal    Collection Time   05/18/11 10:05 AM      Component Value Range   WBC 8.7  4.0 - 10.5 (K/uL)   RBC 4.19  3.87 - 5.11 (MIL/uL)   Hemoglobin 11.7 (*) 12.0 - 15.0 (g/dL)   HCT 09.8  11.9 - 14.7 (%)   MCV 86.4  78.0 - 100.0 (fL)   MCH 27.9  26.0 - 34.0 (pg)   MCHC 32.3  30.0 - 36.0 (g/dL)   RDW 82.9  56.2 - 13.0 (%)   Platelets 238  150 - 400 (K/uL)   Cervix 1/thick/long   Assessment/Plan: 26yo [redacted]w[redacted]d G1P0 admitted for Preeclampsia, w/ noted elevated Prot/Cr ratio and BP >140/90.  - Admit to L&D - Magnesium - Cytotec - GBS Pos - starting PCN - Expectant management   Verdella Laidlaw, MD 05/18/2011, 12:39 PM

## 2011-05-18 NOTE — Progress Notes (Signed)
S/O:  Feeling better after pain med          AVSS  Filed Vitals:   05/18/11 2001  BP: 144/93  Pulse: 84  Temp:   Resp: 20   FHR reactive UCs every 2-5 minutes Foley Bulb in place  Cervix Deferred  A:  Early labor P:  Continue to observe       Reassess when foley falls out

## 2011-05-18 NOTE — Progress Notes (Signed)
[id number] 

## 2011-05-19 LAB — CBC
HCT: 36 % (ref 36.0–46.0)
Hemoglobin: 11.6 g/dL — ABNORMAL LOW (ref 12.0–15.0)
MCH: 27.8 pg (ref 26.0–34.0)
MCHC: 32.2 g/dL (ref 30.0–36.0)
MCV: 86.3 fL (ref 78.0–100.0)
Platelets: 237 10*3/uL (ref 150–400)
RBC: 4.17 MIL/uL (ref 3.87–5.11)
RDW: 14.2 % (ref 11.5–15.5)
WBC: 10.1 10*3/uL (ref 4.0–10.5)

## 2011-05-19 LAB — RPR: RPR Ser Ql: NONREACTIVE

## 2011-05-19 MED ORDER — LACTATED RINGERS IV SOLN: 500.0000 mL | Freq: Once | INTRAVENOUS | Status: DC

## 2011-05-19 MED ORDER — PHENYLEPHRINE 40 MCG/ML (10ML) SYRINGE FOR IV PUSH (FOR BLOOD PRESSURE SUPPORT)
80.0000 ug | PREFILLED_SYRINGE | INTRAVENOUS | Status: DC | PRN
  Filled 2011-05-19: qty 5

## 2011-05-19 MED ORDER — FENTANYL 2.5 MCG/ML BUPIVACAINE 1/10 % EPIDURAL INFUSION (WH - ANES)
14.0000 mL/h | INTRAMUSCULAR | Status: DC
  Administered 2011-05-20 – 2011-05-21 (×6): 14 mL/h via EPIDURAL
  Filled 2011-05-19 (×7): qty 60

## 2011-05-19 MED ORDER — OXYTOCIN 20 UNITS IN LACTATED RINGERS INFUSION - SIMPLE
1.0000 m[IU]/min | INTRAVENOUS | Status: DC
  Administered 2011-05-19: 2 m[IU]/min via INTRAVENOUS
  Filled 2011-05-19: qty 1000

## 2011-05-19 MED ORDER — OXYTOCIN 20 UNITS IN LACTATED RINGERS INFUSION - SIMPLE
1.0000 m[IU]/min | INTRAVENOUS | Status: DC
  Administered 2011-05-20: 22 m[IU]/min via INTRAVENOUS
  Administered 2011-05-21: 18 m[IU]/min via INTRAVENOUS
  Administered 2011-05-21: 20 m[IU]/min via INTRAVENOUS
  Administered 2011-05-21: 26 m[IU]/min via INTRAVENOUS

## 2011-05-19 MED ORDER — PHENYLEPHRINE 40 MCG/ML (10ML) SYRINGE FOR IV PUSH (FOR BLOOD PRESSURE SUPPORT): 80.0000 ug | PREFILLED_SYRINGE | INTRAVENOUS | Status: DC | PRN

## 2011-05-19 MED ORDER — EPHEDRINE 5 MG/ML INJ: 10.0000 mg | INTRAVENOUS | Status: DC | PRN

## 2011-05-19 MED ORDER — EPHEDRINE 5 MG/ML INJ
10.0000 mg | INTRAVENOUS | Status: DC | PRN
  Filled 2011-05-19: qty 4

## 2011-05-19 MED ORDER — DIPHENHYDRAMINE HCL 50 MG/ML IJ SOLN: 12.5000 mg | INTRAMUSCULAR | Status: DC | PRN

## 2011-05-19 MED ORDER — TERBUTALINE SULFATE 1 MG/ML IJ SOLN: 0.2500 mg | Freq: Once | INTRAMUSCULAR | Status: AC | PRN

## 2011-05-19 NOTE — Progress Notes (Signed)
No vaginal exam done, foley bulb in place

## 2011-05-19 NOTE — Progress Notes (Signed)
Patient ID: Erin Stone, female   DOB: Feb 15, 1985, 26 y.o.   MRN: [id number]  Sleeping  FHR 130s with small accels, no decels  UCs irregular about every 10 minutes  Cervix deferred, Foley still in place  Will start Low Dose Pitocin.

## 2011-05-19 NOTE — Progress Notes (Signed)
QUIN MCPHERSON is a 26 y.o. G1P0000 at [redacted]w[redacted]d   Subjective: Doing well. Contractions slightly more uncomfortable. Pain well controlled  Objective: BP 141/86  Pulse 83  Temp(Src) 98 F (36.7 C) (Oral)  Resp 20  Ht 5' 4.5" (1.638 m)  Wt 100.517 kg (221 lb 9.6 oz)  BMI 37.45 kg/m2  SpO2 100%  LMP 08/07/2010 I/O last 3 completed shifts: In: 3790 [P.O.:1060; I.V.:2280; IV Piggyback:450] Out: 2450 [Urine:2450] Total I/O In: 1098 [P.O.:600; I.V.:498] Out: 425 [Urine:425]  FHT:  FHR: 110-120s bpm, variability: moderate,  accelerations:  Present,  decelerations:  Absent UC:   regular, every 3 minutes SVE:   Dilation: 5 Effacement (%): 80 Station: -2 Exam by:: Dr. Konrad Dolores  Labs: Lab Results  Component Value Date   WBC 10.1 05/19/2011   HGB 11.6* 05/19/2011   HCT 36.0 05/19/2011   MCV 86.3 05/19/2011   PLT 237 05/19/2011    Assessment / Plan: Induction of labor due to preeclampsia,  progressing well on pitocin  Labor: Progressing on Pitocin, will continue to increase then AROM Preeclampsia:  on magnesium sulfate and no signs or symptoms of toxicity Fetal Wellbeing:  Category I Pain Control:  Labor support without medications I/D:  PCN for GBS + Anticipated MOD:  NSVD  Siobhan Zaro 05/19/2011, 12:07 PM

## 2011-05-19 NOTE — Progress Notes (Signed)
MARSHIA TROPEA is a 26 y.o. G1P0000 at [redacted]w[redacted]d   Subjective: Doing well. Complaining of increased pain w/ contractions  Objective: BP 132/83  Pulse 88  Temp(Src) 98.2 F (36.8 C) (Oral)  Resp 20  Ht 5' 4.5" (1.638 m)  Wt 100.517 kg (221 lb 9.6 oz)  BMI 37.45 kg/m2  SpO2 100%  LMP 08/07/2010 I/O last 3 completed shifts: In: 3790 [P.O.:1060; I.V.:2280; IV Piggyback:450] Out: 2450 [Urine:2450] Total I/O In: 1713 [P.O.:840; I.V.:873] Out: 950 [Urine:950]  FHT:  FHR: 130s bpm, variability: moderate,  accelerations:  Present,  decelerations:  Absent UC:   regular, every 2-4 minutes SVE:   Dilation: 5 Effacement (%): 80 Station: -1 Exam by:: Dr. Margot Ables  Labs: Lab Results  Component Value Date   WBC 10.1 05/19/2011   HGB 11.6* 05/19/2011   HCT 36.0 05/19/2011   MCV 86.3 05/19/2011   PLT 237 05/19/2011    Assessment / Plan: Induction of labor due to preeclampsia,  progressing well on pitocin  Labor: Progressing on Pitocin, will continue to increase then AROM Preeclampsia:  on magnesium sulfate and no signs or symptoms of toxicity Fetal Wellbeing:  Category I Pain Control:  Nubain I/D:  PCN Anticipated MOD:  NSVD  Sophiana Milanese 05/19/2011, 2:34 PM

## 2011-05-19 NOTE — Progress Notes (Signed)
No vaginal exam done bc of foley bulb in place

## 2011-05-19 NOTE — Progress Notes (Signed)
Erin Stone is a 26 y.o. G1P0000 at [redacted]w[redacted]d by ultrasound admitted for induction of labor due to Pre-eclamptic toxemia of pregnancy..  Subjective:   Objective: BP 122/58  Pulse 79  Temp(Src) 97.9 F (36.6 C) (Oral)  Resp 18  Ht 5' 4.5" (1.638 m)  Wt 221 lb 9.6 oz (100.517 kg)  BMI 37.45 kg/m2  SpO2 100%  LMP 08/07/2010 I/O last 3 completed shifts: In: 1680 [P.O.:600; I.V.:830; IV Piggyback:250] Out: 550 [Urine:550] Total I/O In: 1565 [P.O.:290; I.V.:1075; IV Piggyback:200] Out: 1350 [Urine:1350]  FHT:  FHR: 130 bpm, variability: moderate,  accelerations:  Present,  decelerations:  Absent UC:   irregular, every 5 minutes SVE:   Dilation: 1.5 Effacement (%): 40 Station: -2 Exam by:: Dr. Konrad Stone  Labs: Lab Results  Component Value Date   WBC 9.6 05/18/2011   HGB 11.4* 05/18/2011   HCT 35.1* 05/18/2011   MCV 85.2 05/18/2011   PLT 240 05/18/2011    Assessment / Plan: Induction of labor due to preeclampsia,  progressing well on pitocin (not pitocin, Foley balloon)  Labor: Progressing normally Preeclampsia:  on magnesium sulfate with normal BPs Fetal Wellbeing:  Category I Pain Control:  Labor support without medications  I/D:  n/a Anticipated MOD:  NSVD  Erin Stone 05/19/2011, 3:21 AM

## 2011-05-19 NOTE — Progress Notes (Signed)
Dolly Rias, CNM notified of pt status, FHR tracing with minimal variability, accels, foley still in place, and pt pain level. No orders given at this time.

## 2011-05-19 NOTE — Progress Notes (Signed)
Patient ID: Erin Stone, female   DOB: May 28, 1985, 26 y.o.   MRN: [id number]  Doing well.  FHR reactive with accels.   UCs q 2-4 minutes  Cervix deferred  Will continue to observe

## 2011-05-19 NOTE — Progress Notes (Signed)
Patient ID: Erin Stone, female   DOB: 08-16-1985, 26 y.o.   MRN: [id number]  Sleeping at intervals  FHR baseline somewhat lower at 115-120, occasional accels  Irregular contractions  Cervix deferred... Foley still in  AVSS   Filed Vitals:   05/18/11 2301 05/19/11 0001 05/19/11 0057 05/19/11 0101  BP: 126/85 118/71  110/60  Pulse: 85 81  86  Temp:   97.9 F (36.6 C)   TempSrc:   Oral   Resp: 18 18 18    Height:      Weight:      SpO2:       Will continue to observe

## 2011-05-20 LAB — CBC
HCT: 34.2 % — ABNORMAL LOW (ref 36.0–46.0)
Hemoglobin: 11.2 g/dL — ABNORMAL LOW (ref 12.0–15.0)
MCH: 28 pg (ref 26.0–34.0)
MCHC: 32.7 g/dL (ref 30.0–36.0)
MCV: 85.5 fL (ref 78.0–100.0)
Platelets: 238 10*3/uL (ref 150–400)
RBC: 4 MIL/uL (ref 3.87–5.11)
RDW: 14.2 % (ref 11.5–15.5)
WBC: 9.6 10*3/uL (ref 4.0–10.5)

## 2011-05-20 MED ORDER — LIDOCAINE HCL (PF) 1 % IJ SOLN
INTRAMUSCULAR | Status: DC | PRN
  Administered 2011-05-20 (×3): 4 mL

## 2011-05-20 NOTE — Anesthesia Procedure Notes (Signed)
Epidural Patient location during procedure: OB Start time: 05/20/2011 7:51 AM Reason for block: procedure for pain  Staffing Performed by: anesthesiologist   Preanesthetic Checklist Completed: patient identified, site marked, surgical consent, pre-op evaluation, timeout performed, IV checked, risks and benefits discussed and monitors and equipment checked  Epidural Patient position: sitting Prep: site prepped and draped and DuraPrep Patient monitoring: continuous pulse ox and blood pressure Approach: midline Injection technique: LOR air  Needle:  Needle type: Tuohy  Needle gauge: 17 G Needle length: 9 cm Needle insertion depth: 5 cm cm Catheter type: closed end flexible Catheter size: 19 Gauge Catheter at skin depth: 10 cm Test dose: negative  Assessment Events: blood not aspirated, injection not painful, no injection resistance, negative IV test and no paresthesia  Additional Notes Discussed risk of headache, infection, bleeding, nerve injury and failed or incomplete block.  Patient voices understanding and wishes to proceed.

## 2011-05-20 NOTE — Progress Notes (Signed)
Erin Stone is a 26 y.o. G1P0000 at [redacted]w[redacted]d   Subjective: Dong well; remaining positive despite very long IOL process  Objective: BP 132/91  Pulse 71  Temp(Src) 98.1 F (36.7 C) (Oral)  Resp 18  Ht 5' 4.5" (1.638 m)  Wt 100.517 kg (221 lb 9.6 oz)  BMI 37.45 kg/m2  SpO2 100%  LMP 08/07/2010 I/O last 3 completed shifts: In: 7061.1 [P.O.:2170; I.V.:4291.1; IV Piggyback:600] Out: 5775 [Urine:5775] Total I/O In: 245 [P.O.:120; I.V.:125] Out: 0   FHT:  FHR: 130 bpm, variability: moderate,  accelerations:  Present,  decelerations:  Absent UC:   irregular, every 2-7 minutes SVE:   Dilation: 4.5 Effacement (%): 60 Station: -1 Exam by:: Everrett Coombe RN  Labs: Lab Results  Component Value Date   WBC 10.1 05/19/2011   HGB 11.6* 05/19/2011   HCT 36.0 05/19/2011   MCV 86.3 05/19/2011   PLT 237 05/19/2011    Assessment / Plan: IUP at term IOL process Preeclampsia  Continue to increase Pitocin; may consider AROM at some point today; pt reassured & encouraged   Cam Hai 05/20/2011, 7:22 AM

## 2011-05-20 NOTE — Progress Notes (Signed)
Patient ID: Erin Stone, female   DOB: 31-Oct-1985, 26 y.o.   MRN: [id number]  FHR baseline 140 with periods of minimal variability and intermittent lates.  Ctx irregular, every 2-6 minutes, with resting tone of 30-35.    Plan to stop Pitocin for 1 hour then restart at 2 milliunits/min.

## 2011-05-20 NOTE — Progress Notes (Signed)
Erin Stone is a 26 y.o. G1P0000 at [redacted]w[redacted]d admitted for induction of labor due to preeclampsia, 2 vessel cord.  Subjective: Pt comfortable with epidural.    Objective: BP 132/83  Pulse 93  Temp(Src) 98.4 F (36.9 C) (Oral)  Resp 20  Ht 5' 4.5" (1.638 m)  Wt 100.517 kg (221 lb 9.6 oz)  BMI 37.45 kg/m2  SpO2 97%  LMP 08/07/2010 I/O last 3 completed shifts: In: 7061.1 [P.O.:2170; I.V.:4291.1; IV Piggyback:600] Out: 5775 [Urine:5775] Total I/O In: 2813 [P.O.:690; I.V.:1823; IV Piggyback:300] Out: 1125 [Urine:1125]  FHT:  FHR: 140 bpm, variability: moderate,  accelerations:  Abscent,  decelerations:  Present intermittent lates, resolved with position change. UC:   irregular, every 2-6 minutes SVE:   Dilation: 6.5 Effacement (%): 100 Station: -1 Exam by:: Everest Brod Leftwich-Kirby AROM at 11 am with clear fluid  Labs: Lab Results  Component Value Date   WBC 9.6 05/20/2011   HGB 11.2* 05/20/2011   HCT 34.2* 05/20/2011   MCV 85.5 05/20/2011   PLT 238 05/20/2011    Assessment / Plan: Induction of labor due to preeclampsia,  progressing well on pitocin  Labor: Progressing normally Preeclampsia:  on magnesium sulfate Fetal Wellbeing:  Category I Pain Control:  Epidural I/D:  n/a Anticipated MOD:  NSVD  LEFTWICH-KIRBY, Onofre Gains 05/20/2011, 5:48 PM

## 2011-05-20 NOTE — Progress Notes (Signed)
Erin Stone is a 26 y.o. G1P0000 at [redacted]w[redacted]d admitted for induction of labor due to preeclampsia and 2 vessel cord.  Subjective: Pt comfortable with epidural.  Feeling some rectal pressure with contractions.  Objective: BP 146/89  Pulse 79  Temp(Src) 98.4 F (36.9 C) (Oral)  Resp 20  Ht 5' 4.5" (1.638 m)  Wt 100.517 kg (221 lb 9.6 oz)  BMI 37.45 kg/m2  SpO2 97%  LMP 08/07/2010 I/O last 3 completed shifts: In: 8702.1 [P.O.:2520; I.V.:5382.1; IV Piggyback:800] Out: 5135 [Urine:5135]    FHT:  FHR: 145 bpm, variability: moderate,  accelerations:  Present,  decelerations:  Absent UC:   irregular, every 2-6 minutes SVE:   Dilation: 6.5 Effacement (%): 100 Station: -1 Exam by:: [employer]: Lab Results  Component Value Date   WBC 9.6 05/20/2011   HGB 11.2* 05/20/2011   HCT 34.2* 05/20/2011   MCV 85.5 05/20/2011   PLT 238 05/20/2011    Assessment / Plan: Induction of labor due to preeclampsia,  progressing well on pitocin  Labor: Slow progress Preeclampsia:  on magnesium sulfate Fetal Wellbeing:  Category I Pain Control:  Epidural I/D:  n/a Anticipated MOD:  NSVD  LEFTWICH-KIRBY, Nasir Bright 05/20/2011, 7:59 PM

## 2011-05-20 NOTE — Anesthesia Preprocedure Evaluation (Signed)
Anesthesia Evaluation  Patient identified by MRN, date of birth, ID band Patient awake    Reviewed: Allergy & Precautions, H&P , NPO status , Patient's Chart, lab work & pertinent test results, reviewed documented beta blocker date and time   History of Anesthesia Complications Negative for: history of anesthetic complications  Airway Mallampati: I  TM Distance: >3 FB Neck ROM: full    Dental  (+) Teeth Intact   Pulmonary neg pulmonary ROS,  breath sounds clear to auscultation        Cardiovascular hypertension (PIH), Rhythm:regular Rate:Normal     Neuro/Psych negative neurological ROS  negative psych ROS   GI/Hepatic negative GI ROS, Neg liver ROS,   Endo/Other  Morbid obesity  Renal/GU negative Renal ROS     Musculoskeletal   Abdominal   Peds  Hematology negative hematology ROS (+)   Anesthesia Other Findings   Reproductive/Obstetrics (+) Pregnancy                             Anesthesia Physical Anesthesia Plan  ASA: III  Anesthesia Plan: Epidural   Post-op Pain Management:    Induction:   Airway Management Planned:   Additional Equipment:   Intra-op Plan:   Post-operative Plan:   Informed Consent: I have reviewed the patients History and Physical, chart, labs and discussed the procedure including the risks, benefits and alternatives for the proposed anesthesia with the patient or authorized representative who has indicated his/her understanding and acceptance.     Plan Discussed with:   Anesthesia Plan Comments:         Anesthesia Quick Evaluation  

## 2011-05-20 NOTE — Progress Notes (Signed)
UR chart review completed.  

## 2011-05-20 NOTE — Progress Notes (Signed)
Erin Stone is a 26 y.o. G1P0000 at [redacted]w[redacted]d  Subjective: Resting; was able to eat dinner and have a break from Pitocin, which is going to be restarted soon  Objective: BP 108/60  Pulse 73  Temp(Src) 98.1 F (36.7 C) (Oral)  Resp 18  Ht 5' 4.5" (1.638 m)  Wt 100.517 kg (221 lb 9.6 oz)  BMI 37.45 kg/m2  SpO2 100%  LMP 08/07/2010 I/O last 3 completed shifts: In: 6443.1 [P.O.:2050; I.V.:3743.1; IV Piggyback:650] Out: 3950 [Urine:3950] Total I/O In: 1310 [P.O.:360; I.V.:750; IV Piggyback:200] Out: 925 [Urine:925]  FHT:  FHR: 135 bpm, variability: moderate,  accelerations:  Present,  decelerations:  Absent UC:   rare SVE:   Exam deferred at present Labs: Lab Results  Component Value Date   WBC 10.1 05/19/2011   HGB 11.6* 05/19/2011   HCT 36.0 05/19/2011   MCV 86.3 05/19/2011   PLT 237 05/19/2011    Assessment / Plan: IUP at term Favorable cx (secondary to foley bulb) Preeclampsia- on Mag and stable  Will restart Pitocin soon and increase to attempt to achieve adequate labor  Sade Mehlhoff 05/20/2011, 1:12 AM

## 2011-05-21 ENCOUNTER — Encounter (HOSPITAL_COMMUNITY)
Admission: AD | Disposition: A | Discharge status: Home / Self Care | Payer: Self-pay | Source: Ambulatory Visit | Attending: Obstetrics & Gynecology

## 2011-05-21 ENCOUNTER — Encounter (HOSPITAL_COMMUNITY): Payer: Self-pay | Admitting: Anesthesiology

## 2011-05-21 ENCOUNTER — Encounter (HOSPITAL_COMMUNITY): Payer: Self-pay | Admitting: *Deleted

## 2011-05-21 ENCOUNTER — Inpatient Hospital Stay (HOSPITAL_COMMUNITY): Payer: Commercial Managed Care - PPO | Admitting: Anesthesiology

## 2011-05-21 DIAGNOSIS — O339 Maternal care for disproportion, unspecified: Secondary | ICD-10-CM

## 2011-05-21 DIAGNOSIS — IMO0002 Reserved for concepts with insufficient information to code with codable children: Secondary | ICD-10-CM

## 2011-05-21 DIAGNOSIS — O330 Maternal care for disproportion due to deformity of maternal pelvic bones: Secondary | ICD-10-CM

## 2011-05-21 LAB — CBC
HCT: 33.9 % — ABNORMAL LOW (ref 36.0–46.0)
HCT: 27.2 % — ABNORMAL LOW (ref 36.0–46.0)
Hemoglobin: 11.1 g/dL — ABNORMAL LOW (ref 12.0–15.0)
Hemoglobin: 8.9 g/dL — ABNORMAL LOW (ref 12.0–15.0)
MCH: 27.8 pg (ref 26.0–34.0)
MCH: 28 pg (ref 26.0–34.0)
MCHC: 32.7 g/dL (ref 30.0–36.0)
MCHC: 32.7 g/dL (ref 30.0–36.0)
MCV: 85 fL (ref 78.0–100.0)
MCV: 85.5 fL (ref 78.0–100.0)
Platelets: 216 10*3/uL (ref 150–400)
Platelets: 200 10*3/uL (ref 150–400)
RBC: 3.99 MIL/uL (ref 3.87–5.11)
RBC: 3.18 MIL/uL — ABNORMAL LOW (ref 3.87–5.11)
RDW: 14.2 % (ref 11.5–15.5)
RDW: 14.1 % (ref 11.5–15.5)
WBC: 10.9 10*3/uL — ABNORMAL HIGH (ref 4.0–10.5)
WBC: 14.8 10*3/uL — ABNORMAL HIGH (ref 4.0–10.5)

## 2011-05-21 LAB — MRSA PCR SCREENING: MRSA by PCR: NEGATIVE

## 2011-05-21 LAB — MAGNESIUM: Magnesium: 6.3 mg/dL (ref 1.5–2.5)

## 2011-05-21 SURGERY — Surgical Case
Anesthesia: Epidural | Site: Abdomen | Wound class: Clean Contaminated

## 2011-05-21 MED ORDER — CEFAZOLIN SODIUM 1-5 GM-% IV SOLN
INTRAVENOUS | Status: AC
  Filled 2011-05-21: qty 100

## 2011-05-21 MED ORDER — LACTATED RINGERS IV SOLN
INTRAVENOUS | Status: DC | PRN
  Administered 2011-05-21 (×2): via INTRAVENOUS

## 2011-05-21 MED ORDER — PHENYLEPHRINE HCL 10 MG/ML IJ SOLN
INTRAMUSCULAR | Status: DC | PRN
  Administered 2011-05-21: 80 ug via INTRAVENOUS

## 2011-05-21 MED ORDER — PRENATAL MULTIVITAMIN CH
1.0000 | ORAL_TABLET | Freq: Every day | ORAL | Status: DC
  Administered 2011-05-21 – 2011-05-24 (×4): 1 via ORAL
  Filled 2011-05-21 (×4): qty 1

## 2011-05-21 MED ORDER — LIDOCAINE-EPINEPHRINE (PF) 2 %-1:200000 IJ SOLN
INTRAMUSCULAR | Status: AC
  Filled 2011-05-21: qty 20

## 2011-05-21 MED ORDER — SODIUM CHLORIDE 0.9 % IV SOLN
1.0000 ug/kg/h | INTRAVENOUS | Status: DC | PRN
  Filled 2011-05-21: qty 2.5

## 2011-05-21 MED ORDER — IBUPROFEN 600 MG PO TABS
600.0000 mg | ORAL_TABLET | Freq: Four times a day (QID) | ORAL | Status: DC
  Administered 2011-05-21 – 2011-05-24 (×13): 600 mg via ORAL
  Filled 2011-05-21 (×13): qty 1

## 2011-05-21 MED ORDER — SCOPOLAMINE 1 MG/3DAYS TD PT72
1.0000 | MEDICATED_PATCH | Freq: Once | TRANSDERMAL | Status: AC
  Administered 2011-05-21: 1.5 mg via TRANSDERMAL

## 2011-05-21 MED ORDER — ONDANSETRON HCL 4 MG/2ML IJ SOLN: 4.0000 mg | Freq: Three times a day (TID) | INTRAMUSCULAR | Status: DC | PRN

## 2011-05-21 MED ORDER — SCOPOLAMINE 1 MG/3DAYS TD PT72
MEDICATED_PATCH | TRANSDERMAL | Status: AC
  Filled 2011-05-21: qty 1

## 2011-05-21 MED ORDER — OXYTOCIN 20 UNITS IN LACTATED RINGERS INFUSION - SIMPLE
125.0000 mL/h | INTRAVENOUS | Status: AC
  Administered 2011-05-21: 125 mL/h via INTRAVENOUS

## 2011-05-21 MED ORDER — MEPERIDINE HCL 25 MG/ML IJ SOLN
INTRAMUSCULAR | Status: DC | PRN
  Administered 2011-05-21 (×2): 12.5 mg via INTRAVENOUS

## 2011-05-21 MED ORDER — DIPHENHYDRAMINE HCL 25 MG PO CAPS
25.0000 mg | ORAL_CAPSULE | ORAL | Status: DC | PRN
  Administered 2011-05-21 (×2): 25 mg via ORAL
  Filled 2011-05-21 (×2): qty 1

## 2011-05-21 MED ORDER — LACTATED RINGERS IV SOLN
INTRAVENOUS | Status: DC
  Administered 2011-05-21 – 2011-05-22 (×3): via INTRAVENOUS

## 2011-05-21 MED ORDER — ONDANSETRON HCL 4 MG PO TABS: 4.0000 mg | ORAL_TABLET | ORAL | Status: DC | PRN

## 2011-05-21 MED ORDER — KETOROLAC TROMETHAMINE 30 MG/ML IJ SOLN
30.0000 mg | Freq: Four times a day (QID) | INTRAMUSCULAR | Status: AC | PRN
  Administered 2011-05-21: 30 mg via INTRAVENOUS

## 2011-05-21 MED ORDER — SODIUM CHLORIDE 0.9 % IJ SOLN: 3.0000 mL | INTRAMUSCULAR | Status: DC | PRN

## 2011-05-21 MED ORDER — SIMETHICONE 80 MG PO CHEW
80.0000 mg | CHEWABLE_TABLET | Freq: Three times a day (TID) | ORAL | Status: DC
  Administered 2011-05-21 – 2011-05-24 (×13): 80 mg via ORAL

## 2011-05-21 MED ORDER — ONDANSETRON HCL 4 MG/2ML IJ SOLN: 4.0000 mg | INTRAMUSCULAR | Status: DC | PRN

## 2011-05-21 MED ORDER — MIDAZOLAM HCL 2 MG/2ML IJ SOLN: 0.5000 mg | Freq: Once | INTRAMUSCULAR | Status: DC | PRN

## 2011-05-21 MED ORDER — PHENYLEPHRINE 40 MCG/ML (10ML) SYRINGE FOR IV PUSH (FOR BLOOD PRESSURE SUPPORT)
PREFILLED_SYRINGE | INTRAVENOUS | Status: AC
  Filled 2011-05-21: qty 5

## 2011-05-21 MED ORDER — DIPHENHYDRAMINE HCL 25 MG PO CAPS: 25.0000 mg | ORAL_CAPSULE | Freq: Four times a day (QID) | ORAL | Status: DC | PRN

## 2011-05-21 MED ORDER — OXYCODONE-ACETAMINOPHEN 5-325 MG PO TABS
1.0000 | ORAL_TABLET | ORAL | Status: DC | PRN
  Administered 2011-05-22 – 2011-05-23 (×5): 1 via ORAL
  Filled 2011-05-21 (×5): qty 1

## 2011-05-21 MED ORDER — NALBUPHINE SYRINGE 5 MG/0.5 ML
5.0000 mg | INJECTION | INTRAMUSCULAR | Status: DC | PRN
  Filled 2011-05-21: qty 1

## 2011-05-21 MED ORDER — KETOROLAC TROMETHAMINE 30 MG/ML IJ SOLN
INTRAMUSCULAR | Status: AC
  Filled 2011-05-21: qty 1

## 2011-05-21 MED ORDER — ACETAMINOPHEN 325 MG PO TABS: 325.0000 mg | ORAL_TABLET | ORAL | Status: DC | PRN

## 2011-05-21 MED ORDER — ACETAMINOPHEN 10 MG/ML IV SOLN
1000.0000 mg | Freq: Four times a day (QID) | INTRAVENOUS | Status: AC | PRN
  Filled 2011-05-21: qty 100

## 2011-05-21 MED ORDER — HYDROMORPHONE HCL PF 1 MG/ML IJ SOLN: 1.0000 mg | INTRAMUSCULAR | Status: DC | PRN

## 2011-05-21 MED ORDER — MEPERIDINE HCL 25 MG/ML IJ SOLN: 6.2500 mg | INTRAMUSCULAR | Status: DC | PRN

## 2011-05-21 MED ORDER — NALBUPHINE SYRINGE 5 MG/0.5 ML
5.0000 mg | INJECTION | INTRAMUSCULAR | Status: DC | PRN
Start: 2011-05-21 — End: 2011-05-24
  Filled 2011-05-21: qty 1

## 2011-05-21 MED ORDER — WITCH HAZEL-GLYCERIN EX PADS: 1.0000 "application " | MEDICATED_PAD | CUTANEOUS | Status: DC | PRN

## 2011-05-21 MED ORDER — MEPERIDINE HCL 25 MG/ML IJ SOLN
INTRAMUSCULAR | Status: AC
  Filled 2011-05-21: qty 1

## 2011-05-21 MED ORDER — ONDANSETRON HCL 4 MG/2ML IJ SOLN
INTRAMUSCULAR | Status: DC | PRN
  Administered 2011-05-21: 4 mg via INTRAVENOUS

## 2011-05-21 MED ORDER — DIPHENHYDRAMINE HCL 50 MG/ML IJ SOLN: 12.5000 mg | INTRAMUSCULAR | Status: DC | PRN

## 2011-05-21 MED ORDER — CEFAZOLIN SODIUM 1-5 GM-% IV SOLN
INTRAVENOUS | Status: DC | PRN
  Administered 2011-05-21: 2 g via INTRAVENOUS

## 2011-05-21 MED ORDER — DIPHENHYDRAMINE HCL 50 MG/ML IJ SOLN: 25.0000 mg | INTRAMUSCULAR | Status: DC | PRN

## 2011-05-21 MED ORDER — 0.9 % SODIUM CHLORIDE (POUR BTL) OPTIME
TOPICAL | Status: DC | PRN
  Administered 2011-05-21: 1000 mL

## 2011-05-21 MED ORDER — METOCLOPRAMIDE HCL 5 MG/ML IJ SOLN: 10.0000 mg | Freq: Three times a day (TID) | INTRAMUSCULAR | Status: DC | PRN

## 2011-05-21 MED ORDER — SODIUM BICARBONATE 8.4 % IV SOLN
INTRAVENOUS | Status: AC
  Filled 2011-05-21: qty 50

## 2011-05-21 MED ORDER — FENTANYL CITRATE 0.05 MG/ML IJ SOLN: 25.0000 ug | INTRAMUSCULAR | Status: DC | PRN

## 2011-05-21 MED ORDER — MORPHINE SULFATE (PF) 0.5 MG/ML IJ SOLN
INTRAMUSCULAR | Status: DC | PRN
  Administered 2011-05-21: 3 mg via EPIDURAL

## 2011-05-21 MED ORDER — ONDANSETRON HCL 4 MG/2ML IJ SOLN
INTRAMUSCULAR | Status: AC
  Filled 2011-05-21: qty 2

## 2011-05-21 MED ORDER — LACTATED RINGERS IV BOLUS (SEPSIS)
500.0000 mL | Freq: Once | INTRAVENOUS | Status: AC
  Administered 2011-05-21: 500 mL via INTRAVENOUS

## 2011-05-21 MED ORDER — MENTHOL 3 MG MT LOZG: 1.0000 | LOZENGE | OROMUCOSAL | Status: DC | PRN

## 2011-05-21 MED ORDER — DIBUCAINE 1 % RE OINT: 1.0000 "application " | TOPICAL_OINTMENT | RECTAL | Status: DC | PRN

## 2011-05-21 MED ORDER — OXYTOCIN 20 UNITS IN LACTATED RINGERS INFUSION - SIMPLE
INTRAVENOUS | Status: DC | PRN
  Administered 2011-05-21: 20 [IU] via INTRAVENOUS

## 2011-05-21 MED ORDER — PROMETHAZINE HCL 25 MG/ML IJ SOLN: 6.2500 mg | INTRAMUSCULAR | Status: DC | PRN

## 2011-05-21 MED ORDER — KETOROLAC TROMETHAMINE 30 MG/ML IJ SOLN: 30.0000 mg | Freq: Four times a day (QID) | INTRAMUSCULAR | Status: AC | PRN

## 2011-05-21 MED ORDER — SENNOSIDES-DOCUSATE SODIUM 8.6-50 MG PO TABS
2.0000 | ORAL_TABLET | Freq: Every day | ORAL | Status: DC
  Administered 2011-05-21 – 2011-05-23 (×3): 2 via ORAL

## 2011-05-21 MED ORDER — MORPHINE SULFATE 0.5 MG/ML IJ SOLN
INTRAMUSCULAR | Status: AC
  Filled 2011-05-21: qty 10

## 2011-05-21 MED ORDER — MORPHINE SULFATE (PF) 0.5 MG/ML IJ SOLN
INTRAMUSCULAR | Status: DC | PRN
  Administered 2011-05-21: 2 mg via INTRAVENOUS

## 2011-05-21 MED ORDER — SIMETHICONE 80 MG PO CHEW: 80.0000 mg | CHEWABLE_TABLET | ORAL | Status: DC | PRN

## 2011-05-21 MED ORDER — LANOLIN HYDROUS EX OINT: 1.0000 "application " | TOPICAL_OINTMENT | CUTANEOUS | Status: DC | PRN

## 2011-05-21 MED ORDER — OXYTOCIN 10 UNIT/ML IJ SOLN
INTRAMUSCULAR | Status: AC
  Filled 2011-05-21: qty 4

## 2011-05-21 MED ORDER — OXYTOCIN 20 UNITS IN LACTATED RINGERS INFUSION - SIMPLE
INTRAVENOUS | Status: AC
  Filled 2011-05-21: qty 1000

## 2011-05-21 MED ORDER — MAGNESIUM SULFATE 40 G IN LACTATED RINGERS - SIMPLE
2.0000 g/h | INTRAVENOUS | Status: DC
  Administered 2011-05-21 – 2011-05-22 (×2): 2 g/h via INTRAVENOUS
  Filled 2011-05-21 (×3): qty 500

## 2011-05-21 MED ORDER — SODIUM BICARBONATE 8.4 % IV SOLN
INTRAVENOUS | Status: DC | PRN
  Administered 2011-05-21: 10 mL via EPIDURAL

## 2011-05-21 MED ORDER — ZOLPIDEM TARTRATE 5 MG PO TABS: 5.0000 mg | ORAL_TABLET | Freq: Every evening | ORAL | Status: DC | PRN

## 2011-05-21 MED ORDER — TETANUS-DIPHTH-ACELL PERTUSSIS 5-2.5-18.5 LF-MCG/0.5 IM SUSP
0.5000 mL | Freq: Once | INTRAMUSCULAR | Status: AC
  Administered 2011-05-22: 0.5 mL via INTRAMUSCULAR
  Filled 2011-05-21: qty 0.5

## 2011-05-21 MED ORDER — NALOXONE HCL 0.4 MG/ML IJ SOLN: 0.4000 mg | INTRAMUSCULAR | Status: DC | PRN

## 2011-05-21 SURGICAL SUPPLY — 35 items
APL SKNCLS STERI-STRIP NONHPOA (GAUZE/BANDAGES/DRESSINGS) ×2
BENZOIN TINCTURE PRP APPL 2/3 (GAUZE/BANDAGES/DRESSINGS) ×4 IMPLANT
CLOTH BEACON ORANGE TIMEOUT ST (SAFETY) ×2 IMPLANT
DRSG COVADERM 4X10 (GAUZE/BANDAGES/DRESSINGS) ×2 IMPLANT
ELECT REM PT RETURN 9FT ADLT (ELECTROSURGICAL) ×2
ELECTRODE REM PT RTRN 9FT ADLT (ELECTROSURGICAL) ×1 IMPLANT
EXTRACTOR VACUUM KIWI (MISCELLANEOUS) IMPLANT
GLOVE BIO SURGEON ST LM GN SZ9 (GLOVE) ×2 IMPLANT
GLOVE BIOGEL PI IND STRL 9 (GLOVE) ×2 IMPLANT
GLOVE BIOGEL PI INDICATOR 9 (GLOVE) ×2
GLOVE SKINSENSE NS SZ7.5 (GLOVE) ×1
GLOVE SKINSENSE NS SZ8.0 LF (GLOVE) ×1
GLOVE SKINSENSE STRL SZ7.5 (GLOVE) ×1 IMPLANT
GLOVE SKINSENSE STRL SZ8.0 LF (GLOVE) ×1 IMPLANT
GOWN PREVENTION PLUS LG XLONG (DISPOSABLE) ×2 IMPLANT
GOWN PREVENTION PLUS XLARGE (GOWN DISPOSABLE) ×2 IMPLANT
GOWN STRL REIN 3XL LVL4 (GOWN DISPOSABLE) ×2 IMPLANT
NEEDLE HYPO 25X5/8 SAFETYGLIDE (NEEDLE) IMPLANT
NS IRRIG 1000ML POUR BTL (IV SOLUTION) ×2 IMPLANT
PACK C SECTION WH (CUSTOM PROCEDURE TRAY) ×2 IMPLANT
RETRACTOR WND ALEXIS 25 LRG (MISCELLANEOUS) IMPLANT
RTRCTR C-SECT PINK 25CM LRG (MISCELLANEOUS) ×2 IMPLANT
RTRCTR WOUND ALEXIS 25CM LRG (MISCELLANEOUS)
SLEEVE SCD COMPRESS KNEE LRG (MISCELLANEOUS) ×1 IMPLANT
SLEEVE SCD COMPRESS KNEE MED (MISCELLANEOUS) IMPLANT
STRIP CLOSURE SKIN 1/2X4 (GAUZE/BANDAGES/DRESSINGS) ×4 IMPLANT
SUT CHROMIC 0 CTX 36 (SUTURE) ×6 IMPLANT
SUT VIC AB 0 CT1 27 (SUTURE) ×2
SUT VIC AB 0 CT1 27XBRD ANBCTR (SUTURE) ×1 IMPLANT
SUT VIC AB 2-0 CT1 27 (SUTURE) ×4
SUT VIC AB 2-0 CT1 TAPERPNT 27 (SUTURE) ×2 IMPLANT
SUT VIC AB 4-0 KS 27 (SUTURE) ×2 IMPLANT
SYR BULB IRRIGATION 50ML (SYRINGE) IMPLANT
TRAY FOLEY CATH 14FR (SET/KITS/TRAYS/PACK) ×1 IMPLANT
WATER STERILE IRR 1000ML POUR (IV SOLUTION) ×1 IMPLANT

## 2011-05-21 NOTE — Op Note (Signed)
See operative note details included in the dictated brief operative note

## 2011-05-21 NOTE — Progress Notes (Signed)
Erin Stone is a 26 y.o. G1P0000 at [redacted]w[redacted]d by LMP admitted for induction of labor due to Pre-eclamptic toxemia of pregnancy..  Subjective: Patient has made no further progress, Cx 6-7 /90/-2-3, vertex with marked moulding at very high station. Pelvis clinically inadequate. Efforts at optimizing labor have been made. Cesarean recommended and gratefully accepted. Risks reviewed, bleeding infection, injury to bladder.  Objective: BP 146/89  Pulse 79  Temp(Src) 98.5 F (36.9 C) (Oral)  Resp 20  Ht 5' 4.5" (1.638 m)  Wt 100.517 kg (221 lb 9.6 oz)  BMI 37.45 kg/m2  SpO2 97%  LMP 08/07/2010 I/O last 3 completed shifts: In: 8702.1 [P.O.:2520; I.V.:5382.1; IV Piggyback:800] Out: 5135 [Urine:5135] Total I/O In: 1823.2 [P.O.:840; I.V.:883.2; IV Piggyback:100] Out: 910 [Urine:910]  FHT:  FHR: 145 bpm, variability: minimal ,  accelerations:  Present,  decelerations:  Absent UC:   regular, every 3-4 minutes, mild intensity  SVE:   Dilation: 7.5 Effacement (%): 90 Station: -2 Exam by:: Erin German RN recheck by Erin Stone, no change.  Labs: Lab Results  Component Value Date   WBC 10.9* 05/21/2011   HGB 11.1* 05/21/2011   HCT 33.9* 05/21/2011   MCV 85.0 05/21/2011   PLT 216 05/21/2011   Mag level pending  Assessment / Plan: Arrest in active phase of labor  Labor: cephalopelvic disproportion present Preeclampsia:  on magnesium sulfate, no signs or symptoms of toxicity and Mg level pending plts 220 k Fetal Wellbeing:  Category I Pain Control:  Epidural I/D:   Anticipated MOD:  cesarean advised and accepted. Or notified  Erin Stone V 05/21/2011, 3:19 AM

## 2011-05-21 NOTE — Addendum Note (Signed)
Addendum  created 05/21/11 1027 by Algis Greenhouse, CRNA   Modules edited:Notes Section

## 2011-05-21 NOTE — H&P (Signed)
History reviewed. Patient admitted 3 days ago for IOL for mild preeclampsia based on mild BP elevation and PR/CR ratio of 0.39.  Cervical ripening and iol for preeclampsia done.

## 2011-05-21 NOTE — Brief Op Note (Signed)
05/18/2011 - 05/21/2011  4:49 AM  PATIENT:  Erin Stone  26 y.o. female  PRE-OPERATIVE DIAGNOSIS:  Cephlo Pelvic Disproportion, pregnancy 39.4 weeks, induction of labor for preeclampsia mild  POST-OPERATIVE DIAGNOSIS:  Cephlo Pelvic Disproportion Same PROCEDURE:  Procedure(s) (LRB): CESAREAN SECTION (N/A)  SURGEON:  Surgeon(s) and Role:    * Tilda Burrow, MD - Primary  PHYSICIAN ASSISTANT: None  ASSISTANTS: none   ANESTHESIA:   epidural  EBL:  Total I/O In: 3623.2 [P.O.:840; I.V.:2683.2; IV Piggyback:100] Out: 2010 [Urine:1010; Blood:1000]  BLOOD ADMINISTERED:none  DRAINS: Urinary Catheter (Foley)   LOCAL MEDICATIONS USED:  NONE  SPECIMEN:  Placenta  DISPOSITION OF SPECIMEN:  Labor and delivery  COUNTS:  YES  TOURNIQUET:  * No tourniquets in log *  DICTATION: .Dragon Dictation Ms. Truett was diagnosed with cephalopelvic disproportion approximately 2 AM on May 2 after three-day induction for mild preeclampsia with cervical exam 7 cm 90% effaced -2 to -3 with vertex molded significantly Patient was taken operating room prepped and draped for lower normal surgery with epidural topped up and analgesia confirmed. A Pfannenstiel type incision was performed through a 14 cm transverse skin incision, sharply incised through the subcutaneous fatty tissue. Patient was quite vascular and bled readily. Cautery of the inferior epigastric veins was necessary bilaterally. Pfannenstiel opening of the fascia was performed and peritoneal cavity entered in the midline. Atelectasis wound retractor was positioned and bladder flap developed, and an incision into the uterus carefully performed. The vertex was found to be in an occiput direct posterior position, and rotated into the incision and the infant delivered by fundal pressure and easy careful delivery of the arms using axillary traction. Infant was passed to waiting pediatrician, Dr. Leticia Penna see her notes for further details.  Of note,  Dr. Dimitri Ped noted a small very superficial 5 mm long laceration not extending through the skin at the level of the right eyebrow this did not require reapproximation. The uterine incision was noted to have been for level of the cervix, through the lower cervical tissues. Once the placenta was delivered and cord samples had been obtained and membranes were confirmed to be completely evacuated, the uterus was closed with a 2 layer closure. The closure the first layer was running locking 0 chromic with an oversewing continuous running second layer. Particular care was made to reapproximate tissue edges. Then again and there was extensive vascularity and encountered. Prior to closing this incision a figure-of-eight suture was placed at the patient's left side at the end of the incision, to ensure hemostasis in this area of vascularity. Bladder flap was loosely reapproximated using 2-0 Vicryl. The abdomen was irrigated. A laparotomy, or as removed including the Alexis wound retractor, and the anterior peritoneum closed using 2-0 Vicryl. The fascia was closed in a continuous running fashion with 0 Vicryl. The subcutaneous tissues were inspected, no bleeding  identified, and reapproximated using interrupted 2-0 Vicryl sutures x4. Subcuticular 4-0 Vicryl closure of the skin incision with a Mellody Dance needle was then performed Steri-Strips with benzoin used to complete closure. Sponge and needle counts were correct  PLAN OF CARE: Admit to inpatient   PATIENT DISPOSITION:  PACU - hemodynamically stable.   Delay start of Pharmacological VTE agent (>24hrs) due to surgical blood loss or risk of bleeding: not applicable

## 2011-05-21 NOTE — Progress Notes (Signed)
ELECIA SERAFIN is a 26 y.o. G1P0000 at [redacted]w[redacted]d admitted for induction of labor due to preeclampsia and 2-vessel cord.  Subjective:   Objective: BP 146/89  Pulse 79  Temp(Src) 98.5 F (36.9 C) (Axillary)  Resp 20  Ht 5' 4.5" (1.638 m)  Wt 100.517 kg (221 lb 9.6 oz)  BMI 37.45 kg/m2  SpO2 97%  LMP 08/07/2010 I/O last 3 completed shifts: In: 8702.1 [P.O.:2520; I.V.:5382.1; IV Piggyback:800] Out: 5135 [Urine:5135] Total I/O In: 1140.2 [P.O.:400; I.V.:640.2; IV Piggyback:100] Out: 865 [Urine:865]  FHT:  FHR: 145 bpm, variability: moderate,  accelerations:  Abscent,  decelerations:  Absent UC:   irregular, every 2-3 minutes, MVUs inadequate on 30 milliunits/min Pitocin SVE:   Dilation: 7.5 Effacement (%): 90 Station: -2 Exam by:: [facility] RN  Labs: Lab Results  Component Value Date   WBC 9.6 05/20/2011   HGB 11.2* 05/20/2011   HCT 34.2* 05/20/2011   MCV 85.5 05/20/2011   PLT 238 05/20/2011    Assessment / Plan: Induction of labor due to preeclampsia,  progressing well on pitocin  Labor: Slow progress of labor Preeclampsia:  on magnesium sulfate Fetal Wellbeing:  Category I Pain Control:  Epidural I/D:  n/a Anticipated MOD:  NSVD  LEFTWICH-KIRBY, Marna Weniger 05/21/2011, 1:39 AM

## 2011-05-21 NOTE — Transfer of Care (Signed)
Immediate Anesthesia Transfer of Care Note  [name]  Procedure(s) Performed: Procedure(s) (LRB): CESAREAN SECTION (N/A)  Patient Location: PACU  Anesthesia Type: Epidural  Level of Consciousness: awake  Airway & Oxygen Therapy: Patient Spontanous Breathing  Post-op Assessment: Report given to PACU RN and Post -op Vital signs reviewed and stable  Post vital signs: stable  Complications: No apparent anesthesia complications

## 2011-05-21 NOTE — Anesthesia Postprocedure Evaluation (Signed)
Anesthesia Post Note  [name]  Procedure(s) Performed: Procedure(s) (LRB): CESAREAN SECTION (N/A)  Anesthesia type: Epidural  Patient location: PACU  Post pain: Pain level controlled  Post assessment: Post-op Vital signs reviewed  Last Vitals:  Filed Vitals:   05/21/11 0201  BP:   Pulse:   Temp: 36.9 C  Resp: 20    Post vital signs: Reviewed  Level of consciousness: awake  Complications: No apparent anesthesia complications

## 2011-05-21 NOTE — Anesthesia Postprocedure Evaluation (Signed)
  Anesthesia Post Note  [name]  Procedure(s) Performed: Procedure(s) (LRB): CESAREAN SECTION (N/A)  Anesthesia type: Epidural  Patient location: AICU  Post pain: Pain level controlled  Post assessment: Post-op Vital signs reviewed  Last Vitals:  Filed Vitals:   05/21/11 1000  BP: 132/80  Pulse: 89  Temp:   Resp: 18    Post vital signs: Reviewed  Level of consciousness:alert  Complications: No apparent anesthesia complications

## 2011-05-21 NOTE — Progress Notes (Signed)
UR chart review completed.  

## 2011-05-22 ENCOUNTER — Encounter (HOSPITAL_COMMUNITY): Payer: Self-pay | Admitting: *Deleted

## 2011-05-22 LAB — CBC
HCT: 21.6 % — ABNORMAL LOW (ref 36.0–46.0)
Hemoglobin: 7 g/dL — ABNORMAL LOW (ref 12.0–15.0)
MCH: 27.9 pg (ref 26.0–34.0)
MCHC: 32.4 g/dL (ref 30.0–36.0)
MCV: 86.1 fL (ref 78.0–100.0)
Platelets: 181 10*3/uL (ref 150–400)
RBC: 2.51 MIL/uL — ABNORMAL LOW (ref 3.87–5.11)
RDW: 14.6 % (ref 11.5–15.5)
WBC: 10.3 10*3/uL (ref 4.0–10.5)

## 2011-05-22 NOTE — Progress Notes (Signed)
Subjective: Postpartum Day 1: Cesarean Delivery due to FTP and pre-eclampsia Patient reports incisional pain and tolerating PO.    Objective: Vital signs in last 24 hours: Temp:  [97.8 F (36.6 C)-98.5 F (36.9 C)] 98.1 F (36.7 C) (05/03 0400) Pulse Rate:  [77-102] 77  (05/03 0700) Resp:  [18-20] 20  (05/03 0700) BP: (107-132)/(57-108) 125/72 mmHg (05/03 0700) SpO2:  [94 %-99 %] 97 % (05/03 0700) Weight:  [101.175 kg (223 lb 0.8 oz)] 101.175 kg (223 lb 0.8 oz) (05/03 7829)  Physical Exam:  General: alert, cooperative and no distress Lochia: appropriate Uterine Fundus: firm, +BS Incision: healing well, no significant drainage, no significant erythema, steri-strips in place DVT Evaluation: No evidence of DVT seen on physical exam. No cords or calf tenderness. No significant calf/ankle edema.   Basename 05/22/11 0527 05/21/11 0525  HGB 7.0* 8.9*  HCT 21.6* 27.2*    Assessment/Plan: Status post Cesarean section. Doing well postoperatively.  Continue current care. Will discontinue magnesium sulfate and transfer to postpartum unit  Marabeth Melland 05/22/2011, 7:36 AM

## 2011-05-23 ENCOUNTER — Encounter (HOSPITAL_COMMUNITY): Payer: Self-pay | Admitting: *Deleted

## 2011-05-23 NOTE — Progress Notes (Signed)
Subjective: Postpartum Day 2: Cesarean Delivery Patient reports incisional pain, tolerating PO, + flatus and no problems voiding.    Objective: Vital signs in last 24 hours: Temp:  [97.8 F (36.6 C)-98.8 F (37.1 C)] 97.8 F (36.6 C) (05/04 0555) Pulse Rate:  [64-89] 73  (05/04 0555) Resp:  [18-20] 20  (05/04 0555) BP: (110-138)/(68-93) 110/68 mmHg (05/04 0555) SpO2:  [97 %-98 %] 97 % (05/03 0802)  Physical Exam:  General: alert and no distress Lochia: appropriate Uterine Fundus: firm Incision: healing well, no significant drainage, no significant erythema DVT Evaluation: No evidence of DVT seen on physical exam. Negative Homan's sign.   Basename 05/22/11 0527 05/21/11 0525  HGB 7.0* 8.9*  HCT 21.6* 27.2*    Assessment/Plan: Status post Cesarean section. Doing well postoperatively.  Continue current care Thinking about early discharge, but not sure.  [facility] 05/23/2011, 6:40 AM

## 2011-05-23 NOTE — Discharge Summary (Signed)
Attestation of Attending Supervision of Advanced Practitioner: Evaluation and management procedures were performed by the [facility] Fellow/PA/CNM/NP under my supervision and collaboration. Chart reviewed, and agree with management and plan.  Jaynie Collins, M.D. 05/23/2011 9:18 AM

## 2011-05-23 NOTE — Discharge Summary (Signed)
Obstetric Discharge Summary Reason for Admission: induction of labor for preeclampsia Prenatal Procedures: NST and Preeclampsia Intrapartum Procedures: cesarean: low cervical, transverse Postpartum Procedures: none Complications-Operative and Postpartum: none Hemoglobin  Date Value Range Status  05/22/2011 7.0* 12.0-15.0 (g/dL) Final     REPEATED TO VERIFY     DELTA CHECK NOTED     HCT  Date Value Range Status  05/22/2011 21.6* 36.0-46.0 (%) Final   Hospital Course:   She was admitted for hypertension and proteinuria.  Induction was begun and went on for several days until the Patient made no further progress, Cx 6-7 /90/-2-3, vertex with marked moulding at very high station. Pelvis clinically inadequate.  Efforts at optimizing labor were made. Cesarean recommended and gratefully accepted.  Her postop course has been uneventful. Breastfeeding is going well. She is deemed to have received the full benefit of her hospital stay.   Physical Exam:  General: alert and no distress Lochia: appropriate Uterine Fundus: firm Incision: healing well, no significant drainage, no dehiscence, no significant erythema DVT Evaluation: No evidence of DVT seen on physical exam. Negative Homan's sign.  Discharge Diagnoses: Term Pregnancy-delivered, Failed induction and Preelampsia  Discharge Information: Date: 05/23/2011 Activity: unrestricted and pelvic rest Diet: routine Medications: Ibuprofen and Percocet Condition: stable Instructions: refer to practice specific booklet Discharge to: home   Newborn Data: Live born female  Birth Weight: 8 lb 15.7 oz (4075 g) APGAR: 9, 9  Home with mother.  Erin Stone 05/23/2011, 6:42 AM

## 2011-05-24 ENCOUNTER — Encounter (HOSPITAL_COMMUNITY): Payer: Self-pay | Admitting: *Deleted

## 2011-05-24 MED ORDER — OXYCODONE-ACETAMINOPHEN 5-325 MG PO TABS: 1.0000 | ORAL_TABLET | ORAL | Status: AC | PRN

## 2011-05-24 MED ORDER — NORETHINDRONE 0.35 MG PO TABS: 1.0000 | ORAL_TABLET | Freq: Every day | ORAL | Status: DC

## 2011-05-24 MED ORDER — IBUPROFEN 600 MG PO TABS: 600.0000 mg | ORAL_TABLET | Freq: Four times a day (QID) | ORAL | Status: AC

## 2011-05-24 NOTE — Progress Notes (Signed)
05/24/11 1045 D/c instructions for mom & baby given to pt/FOB - all questions answered.

## 2011-05-24 NOTE — Discharge Summary (Signed)
  Obstetric Discharge Summary  Reason for Admission: induction of labor for preeclampsia  Prenatal Procedures: NST and Preeclampsia  Intrapartum Procedures: cesarean: low cervical, transverse  Postpartum Procedures: none  Complications-Operative and Postpartum: none  Hemoglobin   Date  Value  Range  Status   05/22/2011  7.0*  12.0-15.0 (g/dL)  Final   REPEATED TO VERIFY   DELTA CHECK NOTED      HCT   Date  Value  Range  Status   05/22/2011  21.6*  36.0-46.0 (%)  Final   Hospital Course:  She was admitted for hypertension and proteinuria. Induction was begun and went on for several days until the Patient made no further progress, Cx 6-7 /90/-2-3, vertex with marked moulding at very high station. Pelvis clinically inadequate.  Efforts at optimizing labor were made. Cesarean recommended and gratefully accepted. Her postop course has been uneventful. Breastfeeding is going well. She is deemed to have received the full benefit of her hospital stay.  Physical Exam:  General: alert and no distress  CVS:  RRR, without murmur, gallops, or rubs Lungs:  CTA bilat ABD:  +BSx4, normal Lochia: appropriate  Uterine Fundus: firm  Incision: healing well, no significant drainage, no dehiscence, no significant erythema  DVT Evaluation: No evidence of DVT seen on physical exam.  Negative Homan's sign.  Discharge Diagnoses: Term Pregnancy-delivered, Failed induction and Preelampsia  Discharge Information:  Date: 05/24/2011  Activity: unrestricted and pelvic rest  Diet: routine  Medications:  Micronor, Ibuprofen and Percocet  Condition: stable  Instructions: refer to practice specific booklet  Discharge to: home  Newborn Data:  Live born female  Birth Weight: 8 lb 15.7 oz (4075 g)   MUHAMMAD,Theophilus Walz  APGAR: 9, 9  Home with mother.

## 2011-05-25 ENCOUNTER — Encounter (HOSPITAL_COMMUNITY): Payer: Self-pay | Admitting: Obstetrics and Gynecology

## 2011-06-11 ENCOUNTER — Encounter: Payer: Self-pay | Admitting: *Deleted

## 2011-07-03 ENCOUNTER — Ambulatory Visit (INDEPENDENT_AMBULATORY_CARE_PROVIDER_SITE_OTHER): Payer: Commercial Managed Care - PPO | Admitting: Family

## 2011-07-03 DIAGNOSIS — Z124 Encounter for screening for malignant neoplasm of cervix: Secondary | ICD-10-CM

## 2011-07-03 DIAGNOSIS — Z1151 Encounter for screening for human papillomavirus (HPV): Secondary | ICD-10-CM

## 2011-07-03 DIAGNOSIS — Z113 Encounter for screening for infections with a predominantly sexual mode of transmission: Secondary | ICD-10-CM

## 2011-07-03 MED ORDER — INTEGRA F 125-1 MG PO CAPS: 1.0000 | ORAL_CAPSULE | Freq: Every day | ORAL | Status: DC

## 2011-07-03 NOTE — Progress Notes (Signed)
  Subjective:     Erin Stone is a 26 y.o. female who presents for a postpartum visit. She is 6 weeks postpartum following a low cervical transverse Cesarean section. I have fully reviewed the prenatal and intrapartum course. The delivery was at 39 gestational weeks. Outcome: primary cesarean section, low transverse incision. Anesthesia: spinal. Postpartum course has been unremarkable. Baby's course has been unremarkable. Baby is feeding by breast. Bleeding thin lochia. Bowel function is normal. Bladder function is normal. Patient is not sexually active. Contraception method is oral progesterone-only contraceptive. Postpartum depression screening: negative.  The following portions of the patient's history were reviewed and updated as appropriate: allergies, current medications, past family history, past medical history, past social history, past surgical history and problem list.  Review of Systems Pertinent items are noted in HPI.   Objective:    BP 124/80  Pulse 73  Temp 98.5 F (36.9 C) (Oral)  Resp 16  Ht 5\' 4"  (1.626 m)  Wt 183 lb (83.008 kg)  BMI 31.41 kg/m2  Breastfeeding? Yes  General:  alert, cooperative and appears stated age   Breasts:  inspection negative, no nipple discharge or bleeding, no masses or nodularity palpable  Lungs: clear to auscultation bilaterally  Heart:  regular rate and rhythm, S1, S2 normal, no murmur, click, rub or gallop  Abdomen: soft, non-tender; bowel sounds normal; no masses,  no organomegaly and incision healed well, no signs of infection.   Vulva:  normal  Vagina: normal vagina  Cervix:  no bleeding following Pap, no cervical motion tenderness and scant bleeding  Corpus: normal size, contour, position, consistency, mobility, non-tender  Adnexa:  normal adnexa  Rectal Exam: Normal rectovaginal exam        Assessment:    Normal postpartum exam. Pap smear done at today's visit.  Anemia - Postpartum  Plan:    1. Contraception: oral  progesterone-only contraceptive.  Begin Sunday, take every day at same time. 2. Anemia - take Integra Qday 3. Follow up as needed.

## 2011-07-04 LAB — HEMOGLOBIN: Hemoglobin: 11.5 g/dL — ABNORMAL LOW (ref 12.0–15.0)

## 2011-07-06 ENCOUNTER — Telehealth: Payer: Self-pay | Admitting: *Deleted

## 2011-07-06 NOTE — Telephone Encounter (Signed)
Pt notified of Hgb results.  She will finish her Integra F with refill and then discontinue

## 2011-07-17 ENCOUNTER — Encounter: Payer: Self-pay | Admitting: *Deleted

## 2011-09-23 ENCOUNTER — Telehealth: Payer: Self-pay | Admitting: *Deleted

## 2011-09-23 DIAGNOSIS — Z3041 Encounter for surveillance of contraceptive pills: Secondary | ICD-10-CM

## 2011-09-23 MED ORDER — NORETHIN ACE-ETH ESTRAD-FE 1-20 MG-MCG(24) PO TABS: 1.0000 | ORAL_TABLET | Freq: Every day | ORAL | Status: DC

## 2011-09-23 NOTE — Telephone Encounter (Signed)
Pt called requesting to be changed from Micronor back to Loestrin FE as she is no longer breast feeding.  She has used this prior to pregnancy and liked it.  Per Dr Marice Potter may order this .

## 2012-06-07 IMAGING — US US OB DETAIL+14 WK
1 series · 14 of 28 positions shown · non-contrast
Comparison: none

[Series 1: us ob detail+14 wk · 0.26mm/px · 14 of 68 slices shown]
[im 3/68]
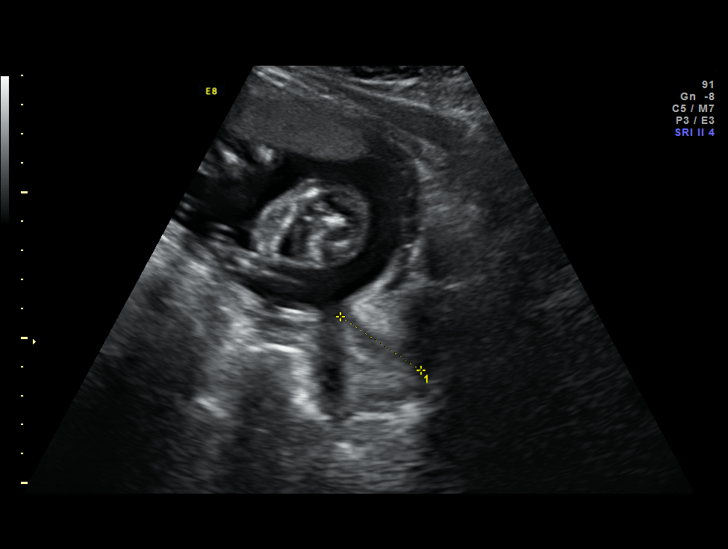
[im 8/68]
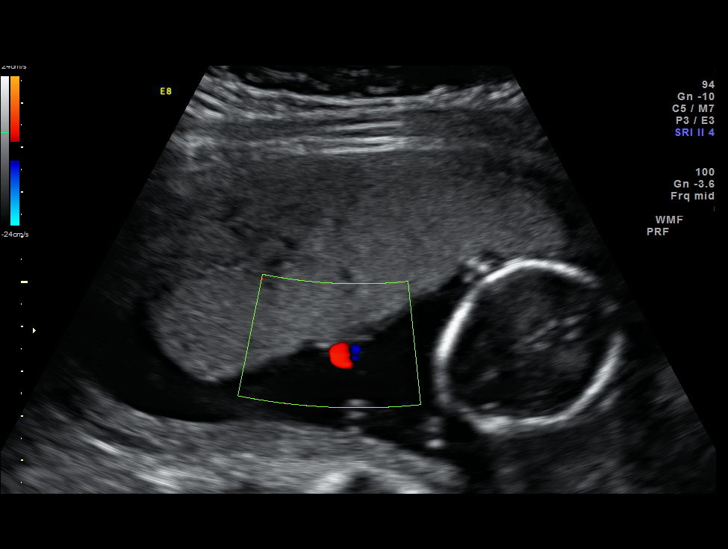
[im 13/68]
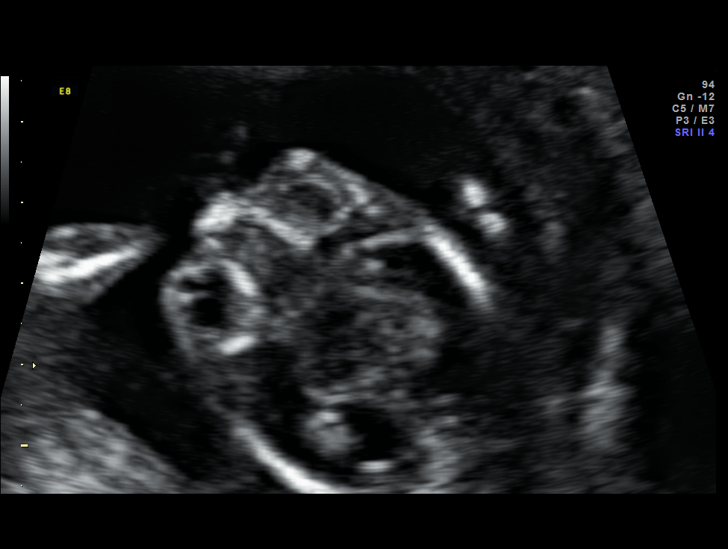
[im 18/68]
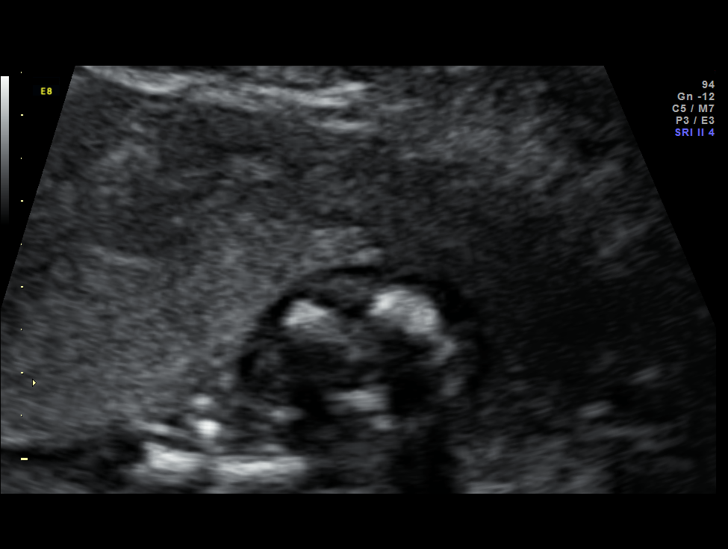
[im 23/68]
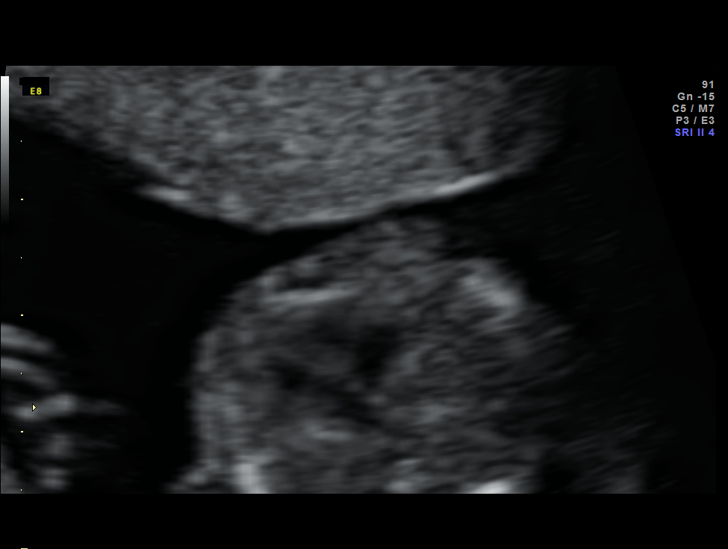
[im 28/68]
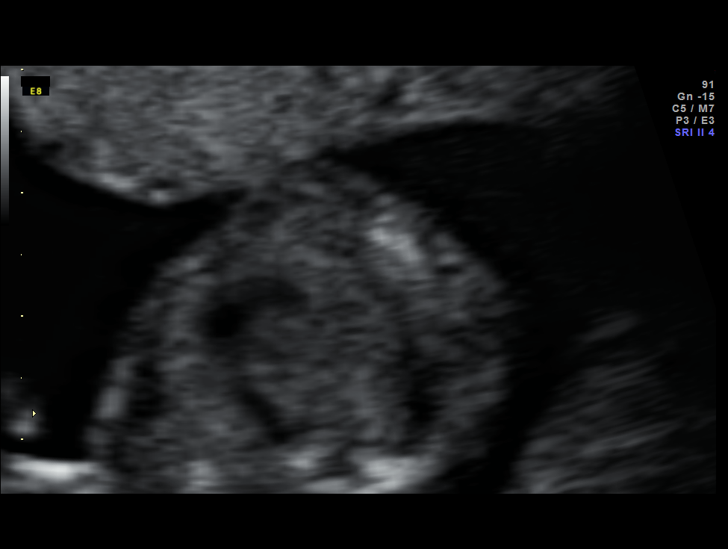
[im 33/68]
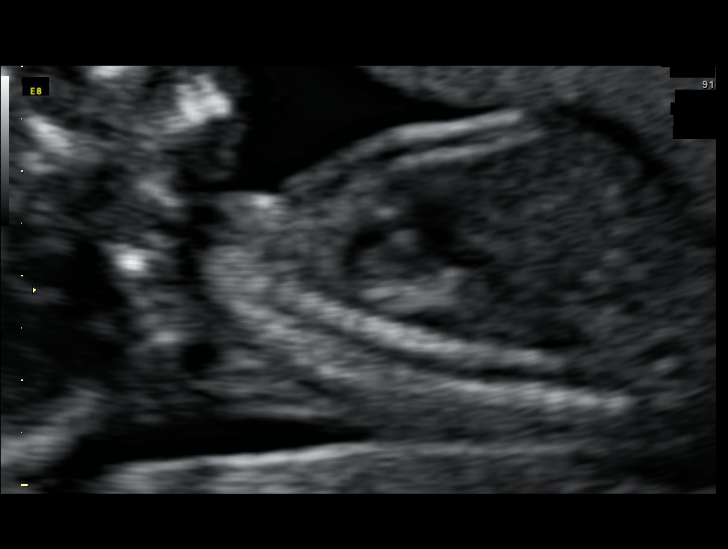
[im 38/68]
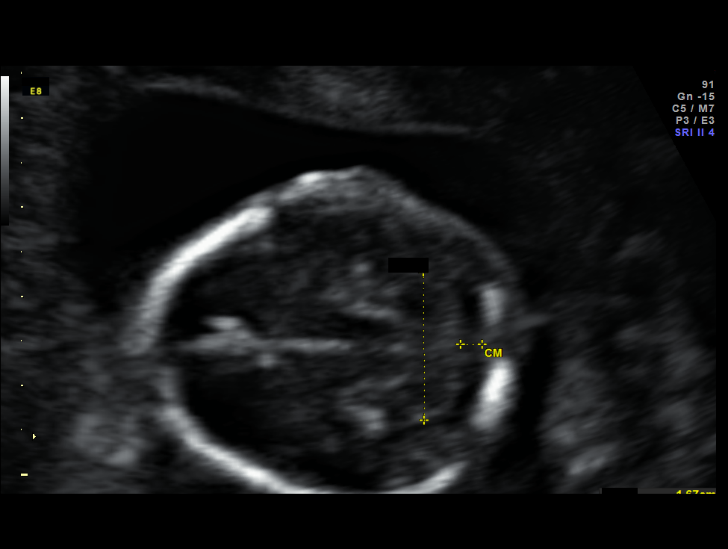
[im 43/68]
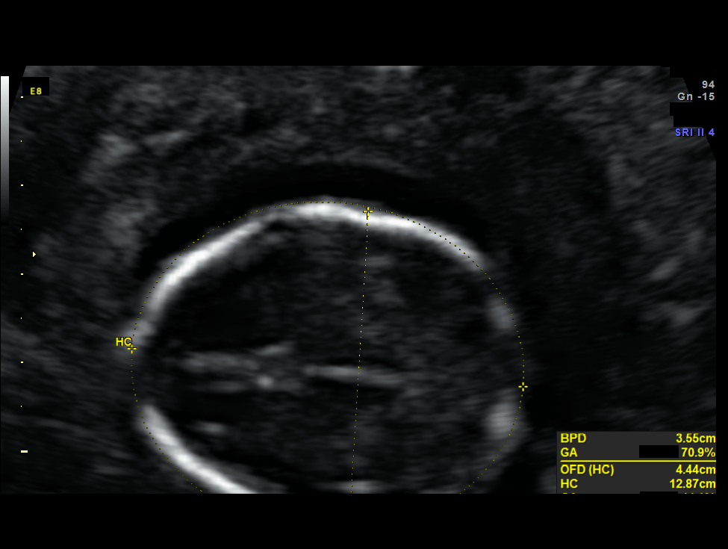
[im 48/68]
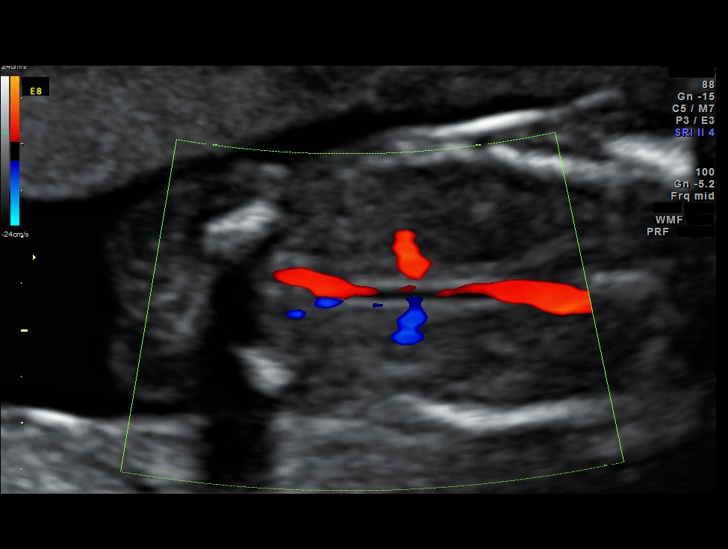
[im 53/68]
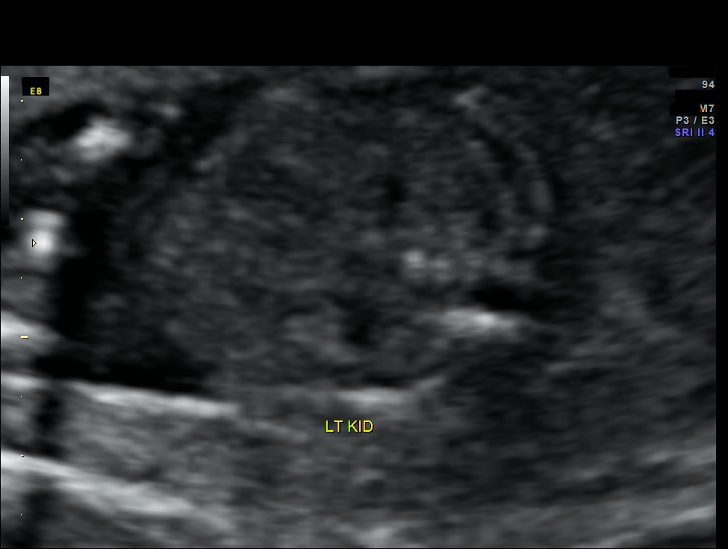
[im 58/68]
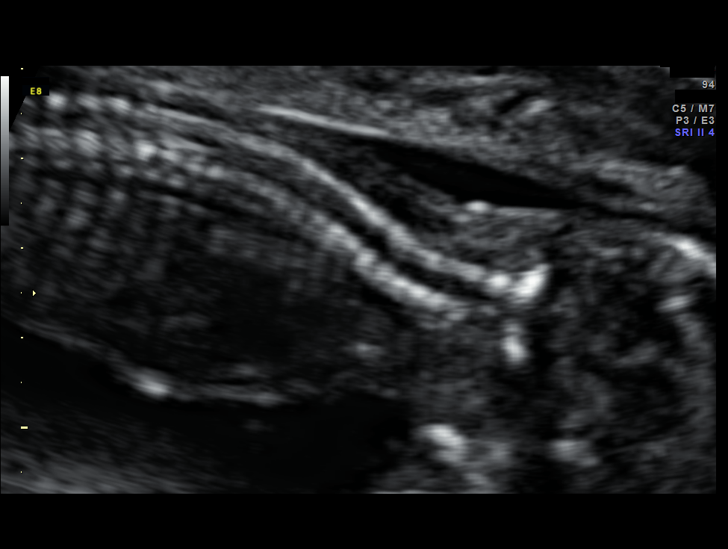
[im 63/68]
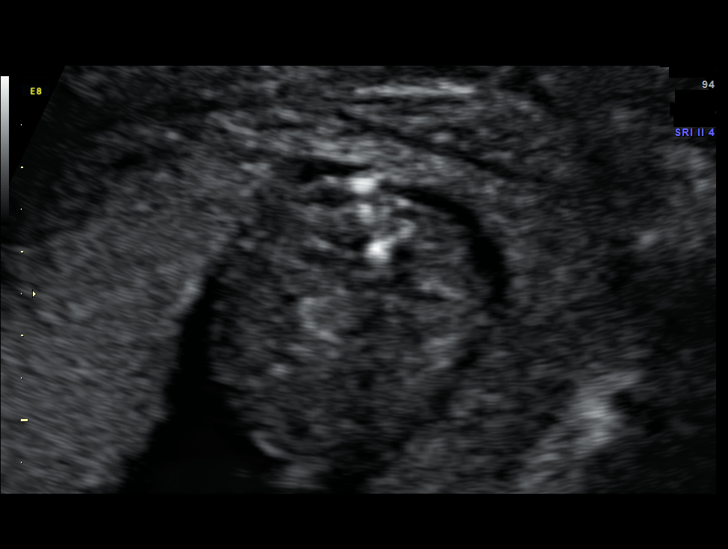
[im 68/68]
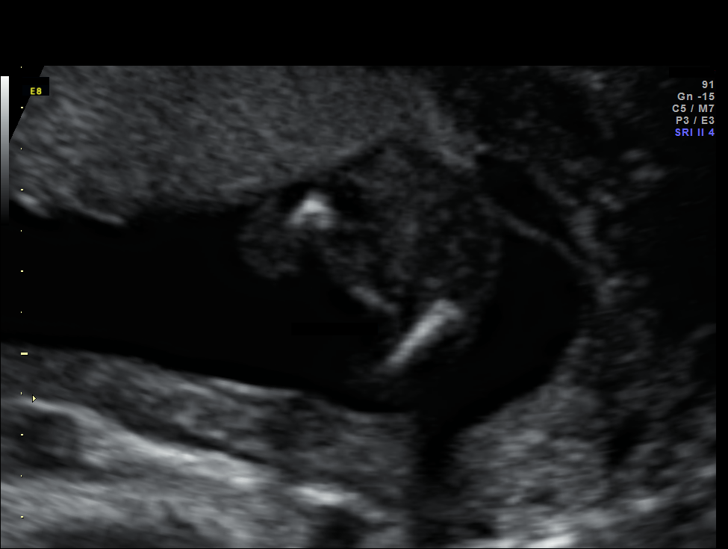

[14 of 28 positions shown; findings below may reference images not displayed]

Canned report from images found in remote index.

Refer to host system for actual result text.

## 2012-09-20 IMAGING — US US OB FOLLOW-UP
1 series · 14 of 28 positions shown · non-contrast
Comparison: none

[Series 1: us ob follow-up · 0.23mm/px · 14 of 45 slices shown]
[im 2/45]
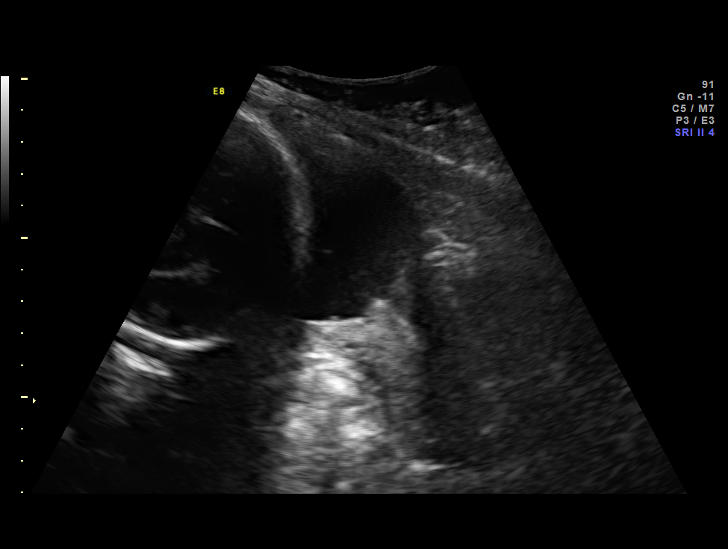
[im 5/45]
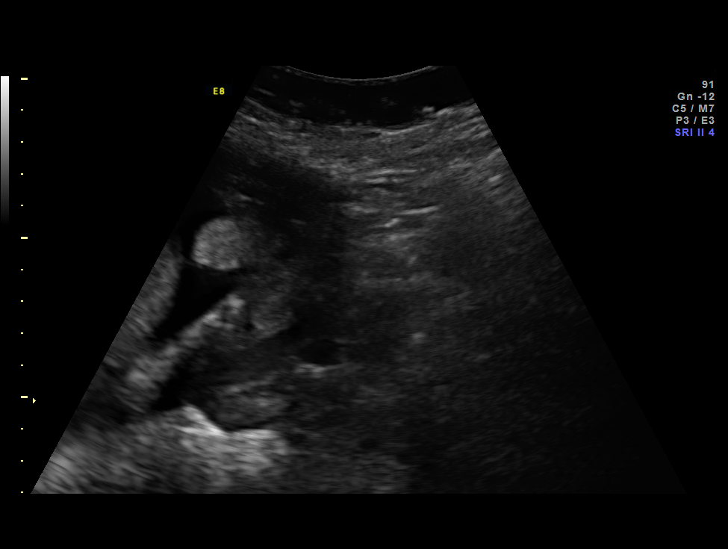
[im 9/45]
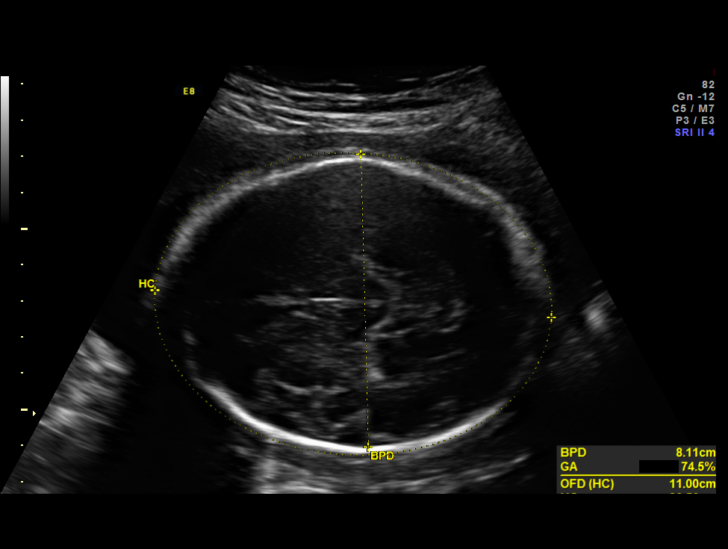
[im 12/45]
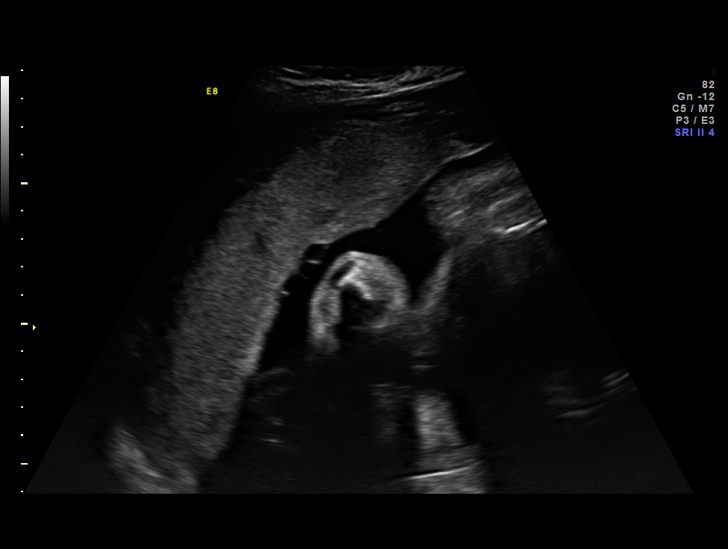
[im 15/45]
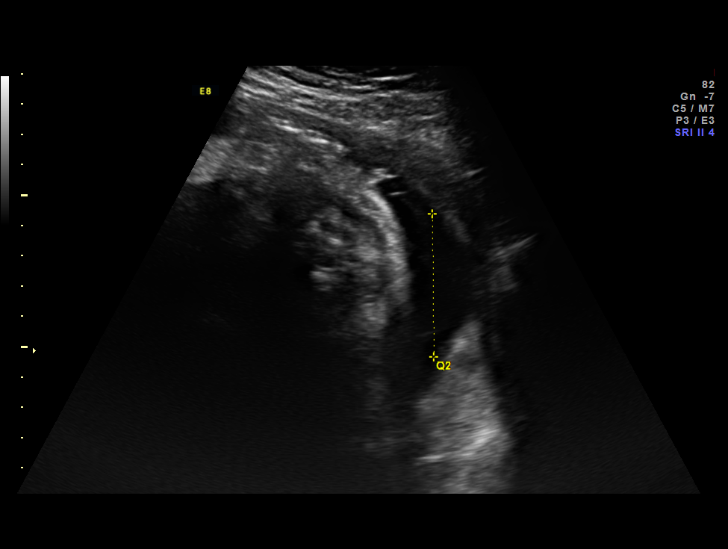
[im 18/45]
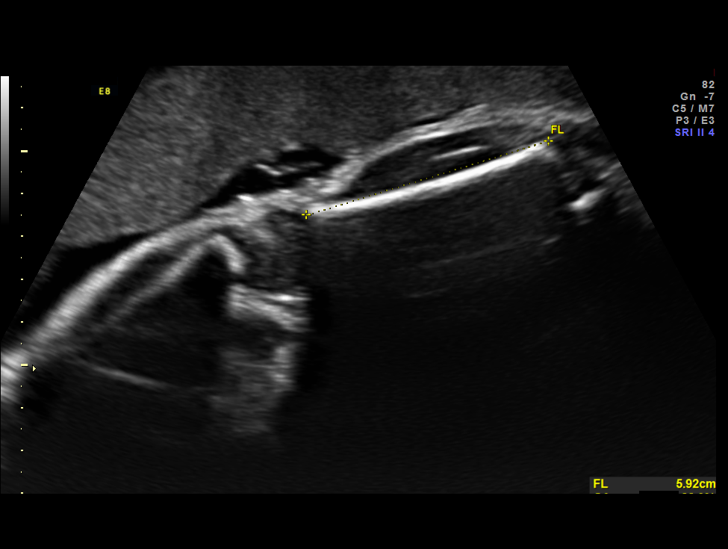
[im 22/45]
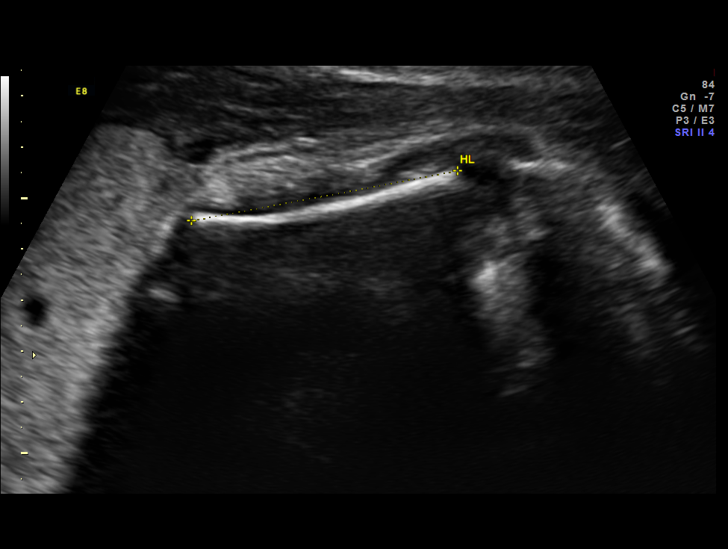
[im 25/45]
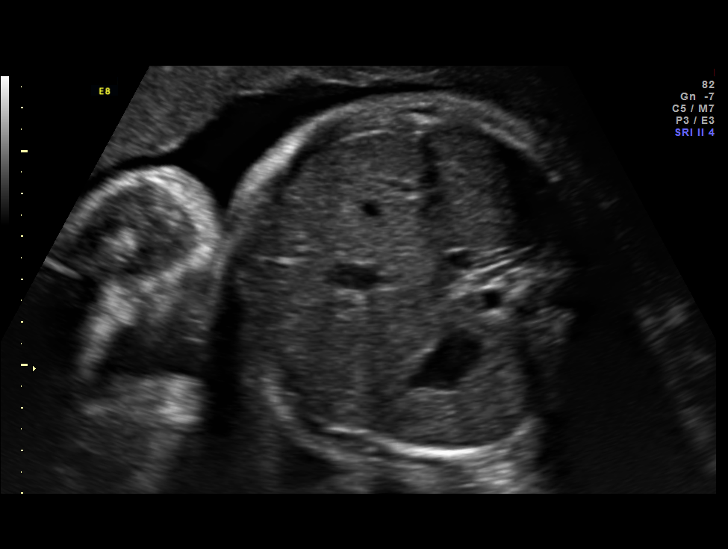
[im 28/45]
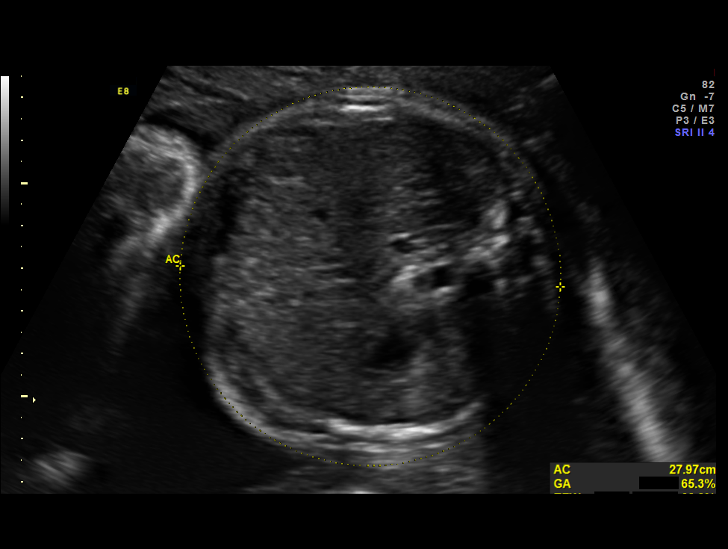
[im 31/45]
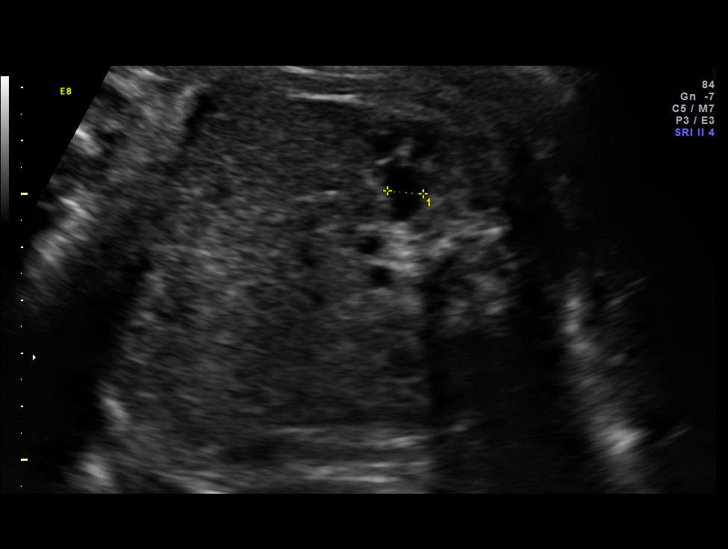
[im 35/45]
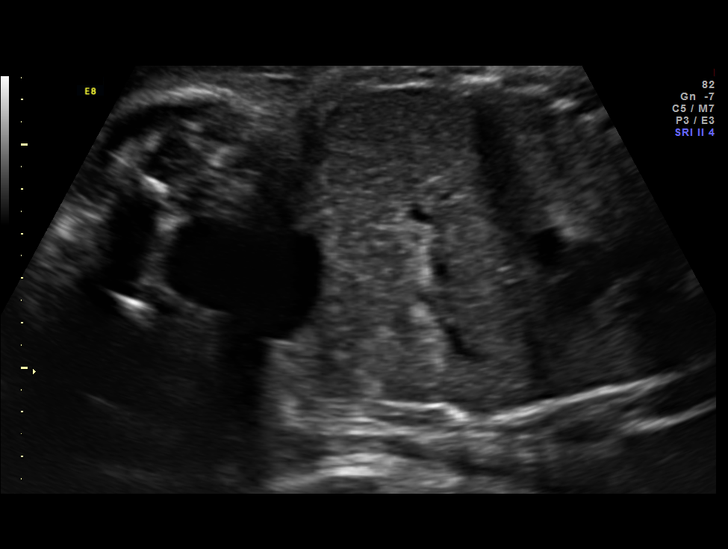
[im 38/45]
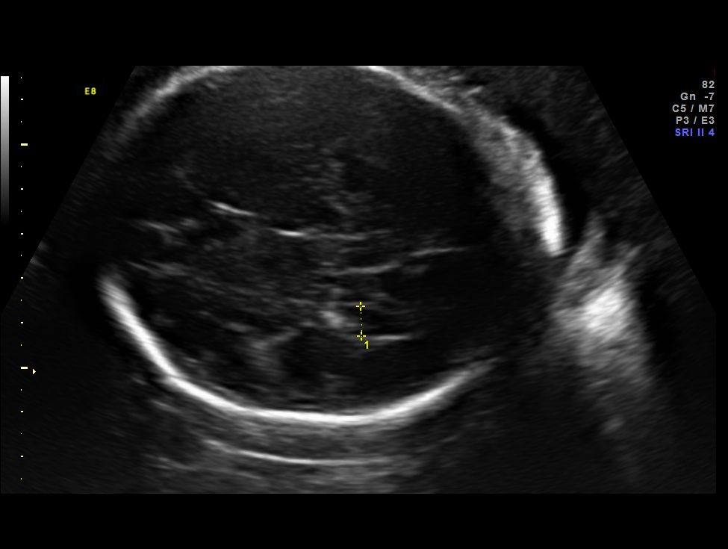
[im 41/45]
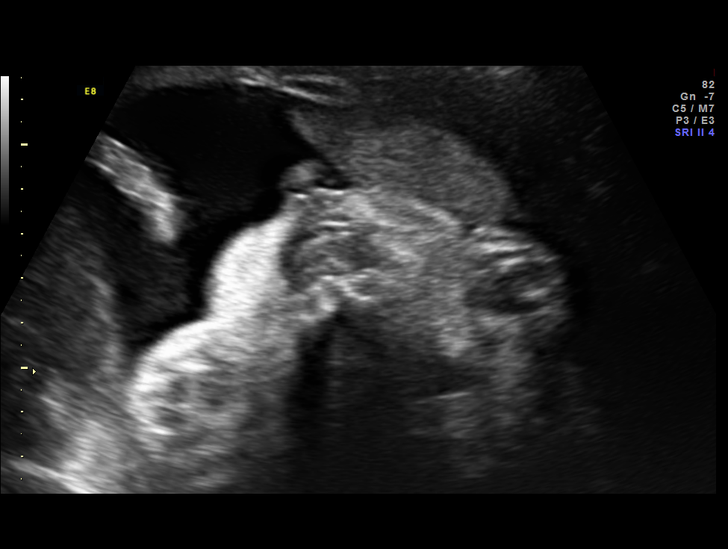
[im 45/45]
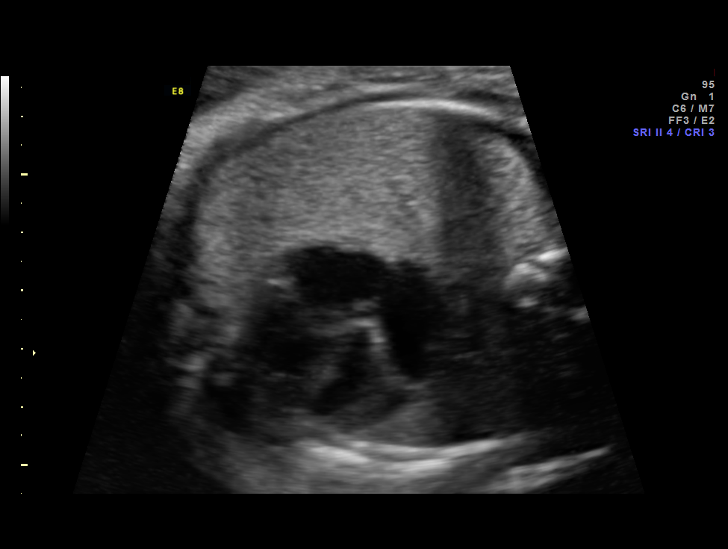

[14 of 28 positions shown; findings below may reference images not displayed]

Canned report from images found in remote index.

Refer to host system for actual result text.

## 2015-12-02 DIAGNOSIS — S83511A Sprain of anterior cruciate ligament of right knee, initial encounter: Secondary | ICD-10-CM

## 2015-12-02 HISTORY — DX: Sprain of anterior cruciate ligament of right knee, initial encounter: S83.511A

## 2016-02-04 DIAGNOSIS — Z9889 Other specified postprocedural states: Secondary | ICD-10-CM | POA: Insufficient documentation

## 2016-02-04 HISTORY — DX: Other specified postprocedural states: Z98.890

## 2018-08-25 DIAGNOSIS — E669 Obesity, unspecified: Secondary | ICD-10-CM | POA: Insufficient documentation

## 2018-08-25 DIAGNOSIS — Z6833 Body mass index (BMI) 33.0-33.9, adult: Secondary | ICD-10-CM | POA: Insufficient documentation

## 2019-04-26 ENCOUNTER — Ambulatory Visit (INDEPENDENT_AMBULATORY_CARE_PROVIDER_SITE_OTHER): Payer: Commercial Managed Care - PPO | Admitting: Medical-Surgical

## 2019-04-26 ENCOUNTER — Encounter: Payer: Self-pay | Admitting: Medical-Surgical

## 2019-04-26 ENCOUNTER — Other Ambulatory Visit: Payer: Self-pay

## 2019-04-26 VITALS — BP 117/82 | HR 75 | Temp 97.7°F | Wt 215.0 lb

## 2019-04-26 DIAGNOSIS — R635 Abnormal weight gain: Secondary | ICD-10-CM | POA: Diagnosis not present

## 2019-04-26 DIAGNOSIS — E282 Polycystic ovarian syndrome: Secondary | ICD-10-CM | POA: Diagnosis not present

## 2019-04-26 DIAGNOSIS — R0683 Snoring: Secondary | ICD-10-CM

## 2019-04-26 MED ORDER — NALTREXONE-BUPROPION HCL ER 8-90 MG PO TB12: ORAL_TABLET | ORAL | 0 refills | Status: DC

## 2019-04-26 NOTE — Progress Notes (Signed)
New Patient Office Visit  Subjective:  Patient ID: Erin Stone, female    DOB: July 24, 1985  Age: 34 y.o. MRN: [id number]  CC:  Chief Complaint  Patient presents with  . Establish Care    HPI Erin Stone presents to establish care and discuss weight loss and PCOS.  Weight concerns- tried Qsymia for 6 months with good results but got a kidney stone, possibly related to meds? Maintained well until last year when stress at work got worse.  Since then she has gained from 180 lbs to 215 today.  She exercises regularly, approximately 30 minutes daily either walking or doing HIIT.  Overall healthy diet, small breakfast approximately 630 to 7:30 in the morning with coffee, eggs, and oatmeal.  Lunch usually consists of salads with chicken breast and veggies.  Dinner is usually eating around 530 to 6:30 PM at night and is comprised of lean meats, vegetables.  She does not snack often but if she does it usually on nuts or cheese.  Drinks plenty of water.  Has cut down on fast food and sweet tea consumption.  Cut out sodas except for the occasional diet soda.  PCOS-was told years ago that she had PCOS that caused her irregular menses.  Reports that she has weight management problems as described above, increased mood swings, and dark thick hair that grows under her chin.  Previously treated with Metformin which caused GI issues.  She felt the Metformin helped more with her mood swings.  Currently not having menses due to having a Mirena IUD in place.  She does still report having the PMS symptoms just prior to when her cycle should happen but she does not have a menstrual flow anymore.  Loud snoring-reports some difficulty with snoring at night.  Her husband tells her that she snores mildly but it seems to be positional when she lays on her left side.  This is gotten worse as her weight has gone up.  Per patient, she does not stop breathing, has not been told she stops breathing.  Some days she does feel  rested when she wakes with others the sleep feels nonrestorative.  Falls asleep around 10 PM at night and wakes approximately 5 AM in the morning to go to the gym.  Previously attempted a sleep study in the sleep lab but was unsuccessful at completion.  Past Medical History:  Diagnosis Date  . PCOS (polycystic ovarian syndrome)     Past Surgical History:  Procedure Laterality Date  . ANTERIOR CRUCIATE LIGAMENT REPAIR  2002  . CESAREAN SECTION  05/21/2011   Procedure: CESAREAN SECTION;  Surgeon: Jonnie Kind, MD;  Location: [facility];  Service: Gynecology;  Laterality: N/A;  Primary Cesarean Section Delivery Baby Girl @ 0400, Apgars 9/9    Family History  Problem Relation Age of Onset  . Diabetes Father   . Prostate cancer Father   . Anesthesia problems Neg Hx     Social History   Socioeconomic History  . Marital status: Married    Spouse name: Not on file  . Number of children: Not on file  . Years of education: Not on file  . Highest education level: Not on file  Occupational History  . Occupation: Glass blower/designer  Tobacco Use  . Smoking status: Never Smoker  . Smokeless tobacco: Never Used  Substance and Sexual Activity  . Alcohol use: Yes    Alcohol/week: 3.0 standard drinks    Types: 3 Glasses of wine  per week    Comment: 1-2 times weekly  . Drug use: No  . Sexual activity: Yes    Partners: Male    Birth control/protection: I.U.D.  Other Topics Concern  . Not on file  Social History Narrative  . Not on file   Social Determinants of Health   Financial Resource Strain:   . Difficulty of Paying Living Expenses:   Food Insecurity:   . Worried About Programme researcher, broadcasting/film/video in the Last Year:   . Barista in the Last Year:   Transportation Needs:   . Freight forwarder (Medical):   Marland Kitchen Lack of Transportation (Non-Medical):   Physical Activity:   . Days of Exercise per Week:   . Minutes of Exercise per Session:   Stress:   . Feeling of Stress :   Social  Connections:   . Frequency of Communication with Friends and Family:   . Frequency of Social Gatherings with Friends and Family:   . Attends Religious Services:   . Active Member of Clubs or Organizations:   . Attends Banker Meetings:   Marland Kitchen Marital Status:   Intimate Partner Violence:   . Fear of Current or Ex-Partner:   . Emotionally Abused:   Marland Kitchen Physically Abused:   . Sexually Abused:     ROS Review of Systems  Constitutional: Positive for unexpected weight change. Negative for chills, fatigue and fever.  Respiratory: Negative for cough, chest tightness and shortness of breath.   Cardiovascular: Negative for chest pain, palpitations and leg swelling.  Gastrointestinal: Negative for abdominal pain, constipation, diarrhea and nausea.  Genitourinary: Negative for dysuria.  Neurological: Negative for dizziness and headaches.  Psychiatric/Behavioral: Negative for self-injury, sleep disturbance and suicidal ideas.    Objective:   Today's Vitals: BP 117/82   Pulse 75   Temp 97.7 F (36.5 C) (Oral)   Wt 215 lb (97.5 kg)   SpO2 99% Comment: on RA  BMI 36.90 kg/m   Physical Exam Vitals reviewed.  Constitutional:      General: She is not in acute distress.    Appearance: Normal appearance.  HENT:     Head: Normocephalic and atraumatic.  Cardiovascular:     Rate and Rhythm: Normal rate and regular rhythm.     Pulses: Normal pulses.     Heart sounds: Normal heart sounds. No murmur. No friction rub. No gallop.   Pulmonary:     Effort: Pulmonary effort is normal. No respiratory distress.     Breath sounds: Normal breath sounds. No wheezing.  Skin:    General: Skin is warm and dry.  Neurological:     Mental Status: She is alert and oriented to person, place, and time.  Psychiatric:        Mood and Affect: Mood normal.        Behavior: Behavior normal.        Thought Content: Thought content normal.        Judgment: Judgment normal.     Assessment & Plan:    1. WEIGHT GAIN, ABNORMAL Discussed weight loss options available.  Due to previous kidney stone while on Qsymia, would like to avoid that medication.  We will try Contrave to see if this helps her with weight loss.  Reviewed recommendations for continued exercise, heart healthy, low-fat diet.  Recommend logging food and exercise, discussed using apps for this. - Naltrexone-buPROPion HCl ER 8-90 MG TB12; 1 tab daily for week 1, then 1 tab BID  for week 2, then 2 tab PO qAM and 1 tab PO qPM for week 3, then 2 tabs BID.  Dispense: 80 tablet; Refill: 0  2. Loud snoring Home sleep study ordered to evaluate for OSA. - Home sleep test  3.  PCOS Discussed options for treatment of PCOS.  Would like to hold off on initiating any treatment currently as we are starting weight loss medication.  Would like to see if weight loss will help manage her symptoms.  Outpatient Encounter Medications as of 04/26/2019  Medication Sig  . Cyanocobalamin (B-12 PO) Take 1 tablet by mouth daily.  Marland Kitchen levonorgestrel (MIRENA) 20 MCG/24HR IUD 1 each by Intrauterine route once.  Marland Kitchen MELATONIN PO Take 1 capsule by mouth at bedtime as needed.  . Multiple Vitamin (MULTIVITAMIN) LIQD Take 5 mLs by mouth daily.  . Multiple Vitamin (MULTIVITAMIN) tablet Take 1 tablet by mouth daily.  . Nutritional Supplements (VITAMIN D BOOSTER PO) Take 1 tablet by mouth daily.  . Omega-3 Fatty Acids (FISH OIL BURP-LESS PO) Take 1 capsule by mouth daily.  . Naltrexone-buPROPion HCl ER 8-90 MG TB12 1 tab daily for week 1, then 1 tab BID for week 2, then 2 tab PO qAM and 1 tab PO qPM for week 3, then 2 tabs BID.  Marland Kitchen Norethindrone Acetate-Ethinyl Estrad-FE (LOESTRIN 24 FE) 1-20 MG-MCG(24) tablet Take 1 tablet by mouth daily.  . [DISCONTINUED] Fe Fum-FePoly-FA-Vit C-Vit B3 (INTEGRA F) 125-1 MG CAPS Take 1 capsule by mouth daily.  . [DISCONTINUED] Prenatal Vit-Fe Fumarate-FA (PRENATAL MULTIVITAMIN) TABS Take 1 tablet by mouth daily.   No  facility-administered encounter medications on file as of 04/26/2019.    Follow-up: Return in about 4 weeks (around 05/24/2019) for weight check.   Thayer Ohm, DNP, APRN, FNP-BC [facility] and Sports Medicine

## 2019-05-03 ENCOUNTER — Telehealth: Payer: Self-pay

## 2019-05-03 NOTE — Telephone Encounter (Signed)
Pt calling wanting to know the status of the naltrexone-bupropion ER 8-90 mg PA. Spoke with Arline Asp who states that we had not received notification. Spoke with the pharmacy and they stated they would fax over the PA information for completion. Pt aware that we are working on the PA and that it can take some time, but we are working on it.

## 2019-05-11 NOTE — Telephone Encounter (Signed)
Received Fax from [facility] they have approved Contrave.   Request ID: [id number] Valid: 05/09/19 -08/01/19. - CF

## 2019-05-24 ENCOUNTER — Ambulatory Visit: Payer: Commercial Managed Care - PPO | Admitting: Medical-Surgical

## 2019-06-04 NOTE — Progress Notes (Signed)
Subjective:    CC: weight check  HPI: Very pleasant 34 year old female presenting for weight check after 4 weeks on Contrave. Tolerating the medication well without side effects. Has started walking 2-3 miles per day and is also doing exercise classes 2-3 times per week at the gym. Notes some dietary changes including smaller portions and healthier choices. Reports a decrease in cravings.   I reviewed the past medical history, family history, social history, surgical history, and allergies today and no changes were needed.  Please see the problem list section below in epic for further details.  Past Medical History: Past Medical History:  Diagnosis Date  . PCOS (polycystic ovarian syndrome)    Past Surgical History: Past Surgical History:  Procedure Laterality Date  . ANTERIOR CRUCIATE LIGAMENT REPAIR  2002  . CESAREAN SECTION  05/21/2011   Procedure: CESAREAN SECTION;  Surgeon: Jonnie Kind, MD;  Location: [facility];  Service: Gynecology;  Laterality: N/A;  Primary Cesarean Section Delivery Baby Girl @ 0400, Apgars 9/9   Social History: Social History   Socioeconomic History  . Marital status: Married    Spouse name: Not on file  . Number of children: Not on file  . Years of education: Not on file  . Highest education level: Not on file  Occupational History  . Occupation: Glass blower/designer  Tobacco Use  . Smoking status: Never Smoker  . Smokeless tobacco: Never Used  Substance and Sexual Activity  . Alcohol use: Yes    Alcohol/week: 3.0 standard drinks    Types: 3 Glasses of wine per week    Comment: 1-2 times weekly  . Drug use: No  . Sexual activity: Yes    Partners: Male    Birth control/protection: I.U.D.  Other Topics Concern  . Not on file  Social History Narrative  . Not on file   Social Determinants of Health   Financial Resource Strain:   . Difficulty of Paying Living Expenses:   Food Insecurity:   . Worried About Charity fundraiser in the Last Year:    . Arboriculturist in the Last Year:   Transportation Needs:   . Film/video editor (Medical):   Marland Kitchen Lack of Transportation (Non-Medical):   Physical Activity:   . Days of Exercise per Week:   . Minutes of Exercise per Session:   Stress:   . Feeling of Stress :   Social Connections:   . Frequency of Communication with Friends and Family:   . Frequency of Social Gatherings with Friends and Family:   . Attends Religious Services:   . Active Member of Clubs or Organizations:   . Attends Archivist Meetings:   Marland Kitchen Marital Status:    Family History: Family History  Problem Relation Age of Onset  . Diabetes Father   . Prostate cancer Father   . Anesthesia problems Neg Hx    Allergies: No Known Allergies Medications: See med rec.  Review of Systems: See HPI for pertinent positives and negatives.   Objective:    General: Well Developed, well nourished, and in no acute distress.  Neuro: Alert and oriented x3.  HEENT: Normocephalic, atraumatic.  Skin: Warm and dry. Cardiac: Regular rate and rhythm, no murmurs rubs or gallops, no lower extremity edema.  Respiratory: Clear to auscultation bilaterally. Not using accessory muscles, speaking in full sentences.   Impression and Recommendations:    1. WEIGHT GAIN, ABNORMAL 5lb weight loss in the last 4 weeks. Plan to continue  taking Contrave at 2 tabs twice daily. Continue lifestyle modifications and increased exercise.  - Naltrexone-buPROPion HCl ER 8-90 MG TB12; Take 2 tablets by mouth 2 (two) times daily.  Dispense: 120 tablet; Refill: 0  Return in about 4 weeks (around 07/03/2019) for weight check. ___________________________________________ Thayer Ohm, DNP, APRN, FNP-BC Primary Care and Sports Medicine [facility]

## 2019-06-05 ENCOUNTER — Ambulatory Visit (INDEPENDENT_AMBULATORY_CARE_PROVIDER_SITE_OTHER): Payer: Commercial Managed Care - PPO | Admitting: Medical-Surgical

## 2019-06-05 ENCOUNTER — Encounter: Payer: Self-pay | Admitting: Medical-Surgical

## 2019-06-05 VITALS — BP 117/78 | HR 73 | Temp 98.0°F | Ht 64.0 in | Wt 210.7 lb

## 2019-06-05 DIAGNOSIS — R635 Abnormal weight gain: Secondary | ICD-10-CM

## 2019-06-05 MED ORDER — NALTREXONE-BUPROPION HCL ER 8-90 MG PO TB12
2.0000 | ORAL_TABLET | Freq: Two times a day (BID) | ORAL | 0 refills | Status: DC
Start: 2019-06-05 — End: 2019-07-03

## 2019-07-03 ENCOUNTER — Encounter: Payer: Self-pay | Admitting: Medical-Surgical

## 2019-07-03 ENCOUNTER — Other Ambulatory Visit: Payer: Self-pay

## 2019-07-03 ENCOUNTER — Ambulatory Visit (INDEPENDENT_AMBULATORY_CARE_PROVIDER_SITE_OTHER): Payer: Commercial Managed Care - PPO | Admitting: Medical-Surgical

## 2019-07-03 VITALS — BP 120/82 | HR 69 | Temp 98.0°F | Ht 64.0 in | Wt 213.8 lb

## 2019-07-03 DIAGNOSIS — Z1159 Encounter for screening for other viral diseases: Secondary | ICD-10-CM | POA: Diagnosis not present

## 2019-07-03 DIAGNOSIS — R635 Abnormal weight gain: Secondary | ICD-10-CM | POA: Diagnosis not present

## 2019-07-03 MED ORDER — NALTREXONE-BUPROPION HCL ER 8-90 MG PO TB12: 2.0000 | ORAL_TABLET | Freq: Two times a day (BID) | ORAL | 0 refills | Status: DC

## 2019-07-03 NOTE — Progress Notes (Signed)
Subjective:    CC: weight check  HPI: Pleasant 34 year old female presenting today for weight check on Contrave.  Reports she has been walking daily 2 to 3 miles.  She is also doing HIIT classes 2-3 times a wee.  She has been incorporating some weight training during her classes using kettle bells and body weight.  She reports her appetite has been decreased and most of the time she has to remind herself to eat.  For breakfast she sometimes skips but occasionally has eggs.  Lunch is involved meat/cheeses and vegetables.  For dinner she usually has a meat with vegetables and occasionally adds tater tots.  She avoids eating late in the evening but endorses that she sometimes has ice cream after dinner.  Drinking water with occasional use of sugar-free flavoring.  Has cut down on sweet tea but does have the occasional Coke when eating out at a restaurant.  Endorses occasional wine/alcohol intake.  I reviewed the past medical history, family history, social history, surgical history, and allergies today and no changes were needed.  Please see the problem list section below in epic for further details.  Past Medical History: Past Medical History:  Diagnosis Date  . PCOS (polycystic ovarian syndrome)    Past Surgical History: Past Surgical History:  Procedure Laterality Date  . ANTERIOR CRUCIATE LIGAMENT REPAIR  2002  . CESAREAN SECTION  05/21/2011   Procedure: CESAREAN SECTION;  Surgeon: Jonnie Kind, MD;  Location: [facility];  Service: Gynecology;  Laterality: N/A;  Primary Cesarean Section Delivery Baby Girl @ 0400, Apgars 9/9   Social History: Social History   Socioeconomic History  . Marital status: Married    Spouse name: Not on file  . Number of children: Not on file  . Years of education: Not on file  . Highest education level: Not on file  Occupational History  . Occupation: Glass blower/designer  Tobacco Use  . Smoking status: Never Smoker  . Smokeless tobacco: Never Used  Vaping  Use  . Vaping Use: Never used  Substance and Sexual Activity  . Alcohol use: Yes    Alcohol/week: 3.0 standard drinks    Types: 3 Glasses of wine per week    Comment: 1-2 times weekly  . Drug use: No  . Sexual activity: Yes    Partners: Male    Birth control/protection: I.U.D.  Other Topics Concern  . Not on file  Social History Narrative  . Not on file   Social Determinants of Health   Financial Resource Strain:   . Difficulty of Paying Living Expenses:   Food Insecurity:   . Worried About Charity fundraiser in the Last Year:   . Arboriculturist in the Last Year:   Transportation Needs:   . Film/video editor (Medical):   Marland Kitchen Lack of Transportation (Non-Medical):   Physical Activity:   . Days of Exercise per Week:   . Minutes of Exercise per Session:   Stress:   . Feeling of Stress :   Social Connections:   . Frequency of Communication with Friends and Family:   . Frequency of Social Gatherings with Friends and Family:   . Attends Religious Services:   . Active Member of Clubs or Organizations:   . Attends Archivist Meetings:   Marland Kitchen Marital Status:    Family History: Family History  Problem Relation Age of Onset  . Diabetes Father   . Prostate cancer Father   . Anesthesia problems Neg  Hx    Allergies: No Known Allergies Medications: See med rec.  Review of Systems: See HPI for pertinent positives and negatives.   Objective:    General: Well Developed, well nourished, and in no acute distress.  Neuro: Alert and oriented x3.  HEENT: Normocephalic, atraumatic.  Skin: Warm and dry. Cardiac: Regular rate and rhythm, no murmurs rubs or gallops, no lower extremity edema.  Respiratory: Clear to auscultation bilaterally. Not using accessory muscles, speaking in full sentences.  Impression and Recommendations:    1. WEIGHT GAIN, ABNORMAL 2 pound weight gain over the last 4 weeks.  Reviewed recommendations for expected weight loss while on medications.   We will continue Contrave for another 4 weeks and reevaluate.  Advised patient to begin logging food into her fitness app as this may help her identify areas of improvement.  Discussed limiting alcohol and sugary beverages as this can slow metabolism and increase calories.  Also recommend getting a standing desk for her sedentary job as this may help her be more active and burn more calories throughout the day. - Naltrexone-buPROPion HCl ER 8-90 MG TB12; Take 2 tablets by mouth 2 (two) times daily.  Dispense: 120 tablet; Refill: 0  2. Need for hepatitis C screening test Discussed recommendations for hepatitis C screening.  Patient amenable to have this done with next blood work.  Order entered today and we will draw with her annual physical exam labs when she returns. - Hepatitis C antibody  Return in about 4 weeks (around 07/31/2019) for weight check.  Return for annual physical exam at your convenience. ___________________________________________ Thayer Ohm, DNP, APRN, FNP-BC Primary Care and Sports Medicine [facility] [place]

## 2019-07-28 ENCOUNTER — Ambulatory Visit (INDEPENDENT_AMBULATORY_CARE_PROVIDER_SITE_OTHER): Payer: Commercial Managed Care - PPO | Admitting: Medical-Surgical

## 2019-07-28 ENCOUNTER — Encounter: Payer: Self-pay | Admitting: Medical-Surgical

## 2019-07-28 VITALS — BP 122/83 | HR 70 | Temp 98.2°F | Ht 64.0 in | Wt 210.0 lb

## 2019-07-28 DIAGNOSIS — Z6836 Body mass index (BMI) 36.0-36.9, adult: Secondary | ICD-10-CM

## 2019-07-28 MED ORDER — PHENTERMINE-TOPIRAMATE ER 7.5-46 MG PO CP24: 1.0000 | ORAL_CAPSULE | Freq: Every morning | ORAL | 0 refills | Status: DC

## 2019-07-28 MED ORDER — PHENTERMINE-TOPIRAMATE ER 3.75-23 MG PO CP24: 1.0000 | ORAL_CAPSULE | Freq: Every morning | ORAL | 0 refills | Status: DC

## 2019-07-28 NOTE — Progress Notes (Signed)
Subjective:    CC: weight check  HPI: Very pleasant 34 year old female presenting today for weight check on Contrave. Taking the medication as prescribed, tolerating well without side effects. Notes a drop in energy in the afternoons. Doing well with exercise, walking 3 times weekly and doing 18m of HIIT twice weekly. Eating approximately 1500-1700 calories per day, low carbs, mostly meats and veggies. Only drinks alcohol occasionally. Denies CP, SOB, palpitations, GI upset, and headaches. Previously took Qsymia and felt that she did better on that medication in regards to results.   I reviewed the past medical history, family history, social history, surgical history, and allergies today and no changes were needed.  Please see the problem list section below in epic for further details.  Past Medical History: Past Medical History:  Diagnosis Date  . PCOS (polycystic ovarian syndrome)    Past Surgical History: Past Surgical History:  Procedure Laterality Date  . ANTERIOR CRUCIATE LIGAMENT REPAIR  2002  . CESAREAN SECTION  05/21/2011   Procedure: CESAREAN SECTION;  Surgeon: Tilda Burrow, MD;  Location: [facility];  Service: Gynecology;  Laterality: N/A;  Primary Cesarean Section Delivery Baby Girl @ 0400, Apgars 9/9   Social History: Social History   Socioeconomic History  . Marital status: Married    Spouse name: Not on file  . Number of children: Not on file  . Years of education: Not on file  . Highest education level: Not on file  Occupational History  . Occupation: Data processing manager  Tobacco Use  . Smoking status: Never Smoker  . Smokeless tobacco: Never Used  Vaping Use  . Vaping Use: Never used  Substance and Sexual Activity  . Alcohol use: Yes    Alcohol/week: 3.0 standard drinks    Types: 3 Glasses of wine per week    Comment: 1-2 times weekly  . Drug use: No  . Sexual activity: Yes    Partners: Male    Birth control/protection: I.U.D.  Other Topics Concern  . Not  on file  Social History Narrative  . Not on file   Social Determinants of Health   Financial Resource Strain:   . Difficulty of Paying Living Expenses:   Food Insecurity:   . Worried About Programme researcher, broadcasting/film/video in the Last Year:   . Barista in the Last Year:   Transportation Needs:   . Freight forwarder (Medical):   Marland Kitchen Lack of Transportation (Non-Medical):   Physical Activity:   . Days of Exercise per Week:   . Minutes of Exercise per Session:   Stress:   . Feeling of Stress :   Social Connections:   . Frequency of Communication with Friends and Family:   . Frequency of Social Gatherings with Friends and Family:   . Attends Religious Services:   . Active Member of Clubs or Organizations:   . Attends Banker Meetings:   Marland Kitchen Marital Status:    Family History: Family History  Problem Relation Age of Onset  . Diabetes Father   . Prostate cancer Father   . Anesthesia problems Neg Hx    Allergies: No Known Allergies Medications: See med rec.  Review of Systems: No fevers, chills, night sweats, weight loss, chest pain, or shortness of breath.   Objective:    General: Well Developed, well nourished, and in no acute distress.  Neuro: Alert and oriented x3.  HEENT: Normocephalic, atraumatic.  Skin: Warm and dry. Cardiac: Regular rate and rhythm, no murmurs  rubs or gallops, no lower extremity edema.  Respiratory: Clear to auscultation bilaterally. Not using accessory muscles, speaking in full sentences.   Impression and Recommendations:    1. BMI 36.0-36.9,adult Discontinue Contrave. Start Qsymia with low dose for 14 days then increase to higher dose. Advised that if insurance does not cover the medication, we can resend it as two pills and this may help with cost. Recommend lowering calorie goal to 1200-1400 per day and continuing exercise.  - Phentermine-Topiramate 3.75-23 MG CP24; Take 1 tablet by mouth every morning.  Dispense: 14 capsule; Refill:  0 - Phentermine-Topiramate 7.5-46 MG CP24; Take 1 tablet by mouth every morning.  Dispense: 30 capsule; Refill: 0  Return in about 4 weeks (around 08/25/2019) for weight check. ___________________________________________ Thayer Ohm, DNP, APRN, FNP-BC Primary Care and Sports Medicine [facility] [place]

## 2019-07-31 ENCOUNTER — Telehealth: Payer: Self-pay | Admitting: Medical-Surgical

## 2019-07-31 NOTE — Telephone Encounter (Signed)
Received fax for PA on Qsymia sent through cover my meds waiting on determination. - CF °

## 2019-07-31 NOTE — Telephone Encounter (Signed)
Received fax for PA on Qsymia 7.5-46 mg sent through cover my meds waiting on determination. - CF

## 2019-08-02 ENCOUNTER — Telehealth: Payer: Self-pay | Admitting: Medical-Surgical

## 2019-08-03 ENCOUNTER — Telehealth: Payer: Self-pay

## 2019-08-03 DIAGNOSIS — Z7689 Persons encountering health services in other specified circumstances: Secondary | ICD-10-CM

## 2019-08-03 NOTE — Telephone Encounter (Signed)
Form received from [facility] stating that they need documentation of a negative pregnancy for the determination of approval for Qsymia. I called and spoke with pt and informed her that Ander Slade would like for her to come in as a nurse visit for a POCT pregnancy test. Pt agreeable to appt. Call transferred to the front desk for scheduling. Order for POCT pregnancy test entered.

## 2019-08-07 ENCOUNTER — Other Ambulatory Visit: Payer: Self-pay | Admitting: Medical-Surgical

## 2019-08-07 ENCOUNTER — Other Ambulatory Visit: Payer: Self-pay

## 2019-08-07 ENCOUNTER — Ambulatory Visit (INDEPENDENT_AMBULATORY_CARE_PROVIDER_SITE_OTHER): Payer: Commercial Managed Care - PPO | Admitting: Medical-Surgical

## 2019-08-07 DIAGNOSIS — Z3202 Encounter for pregnancy test, result negative: Secondary | ICD-10-CM

## 2019-08-07 DIAGNOSIS — R635 Abnormal weight gain: Secondary | ICD-10-CM

## 2019-08-07 LAB — POCT URINE PREGNANCY: Preg Test, Ur: NEGATIVE

## 2019-08-07 NOTE — Progress Notes (Signed)
Established Patient Office Visit  Subjective:  Patient ID: Erin Stone, female    DOB: 1986-01-19  Age: 34 y.o. MRN: [id number]  CC:  Chief Complaint  Patient presents with  . Labs Only    HPI Erin Stone presents for POCT for HCG.   Thermon Leyland, CMA      08/03/19 1:34 PM Note Form received from [facility] stating that they need documentation of a negative pregnancy for the determination of approval for Qsymia. I called and spoke with pt and informed her that Ander Slade would like for her to come in as a nurse visit for a POCT pregnancy test. Pt agreeable to appt. Call transferred to the front desk for scheduling. Order for POCT pregnancy test entered.        Past Medical History:  Diagnosis Date  . PCOS (polycystic ovarian syndrome)     Past Surgical History:  Procedure Laterality Date  . ANTERIOR CRUCIATE LIGAMENT REPAIR  2002  . CESAREAN SECTION  05/21/2011   Procedure: CESAREAN SECTION;  Surgeon: Tilda Burrow, MD;  Location: [facility];  Service: Gynecology;  Laterality: N/A;  Primary Cesarean Section Delivery Baby Girl @ 0400, Apgars 9/9    Family History  Problem Relation Age of Onset  . Diabetes Father   . Prostate cancer Father   . Anesthesia problems Neg Hx     Social History   Socioeconomic History  . Marital status: Married    Spouse name: Not on file  . Number of children: Not on file  . Years of education: Not on file  . Highest education level: Not on file  Occupational History  . Occupation: Data processing manager  Tobacco Use  . Smoking status: Never Smoker  . Smokeless tobacco: Never Used  Vaping Use  . Vaping Use: Never used  Substance and Sexual Activity  . Alcohol use: Yes    Alcohol/week: 3.0 standard drinks    Types: 3 Glasses of wine per week    Comment: 1-2 times weekly  . Drug use: No  . Sexual activity: Yes    Partners: Male    Birth control/protection: I.U.D.  Other Topics Concern  . Not on file  Social  History Narrative  . Not on file   Social Determinants of Health   Financial Resource Strain:   . Difficulty of Paying Living Expenses:   Food Insecurity:   . Worried About Programme researcher, broadcasting/film/video in the Last Year:   . Barista in the Last Year:   Transportation Needs:   . Freight forwarder (Medical):   Marland Kitchen Lack of Transportation (Non-Medical):   Physical Activity:   . Days of Exercise per Week:   . Minutes of Exercise per Session:   Stress:   . Feeling of Stress :   Social Connections:   . Frequency of Communication with Friends and Family:   . Frequency of Social Gatherings with Friends and Family:   . Attends Religious Services:   . Active Member of Clubs or Organizations:   . Attends Banker Meetings:   Marland Kitchen Marital Status:   Intimate Partner Violence:   . Fear of Current or Ex-Partner:   . Emotionally Abused:   Marland Kitchen Physically Abused:   . Sexually Abused:     Outpatient Medications Prior to Visit  Medication Sig Dispense Refill  . Cyanocobalamin (B-12 PO) Take 1 tablet by mouth daily.    Marland Kitchen levonorgestrel (MIRENA) 20 MCG/24HR IUD  1 each by Intrauterine route once.    Marland Kitchen MELATONIN PO Take 1 capsule by mouth at bedtime as needed.    . Multiple Vitamin (MULTIVITAMIN) tablet Take 1 tablet by mouth daily.    . Nutritional Supplements (VITAMIN D BOOSTER PO) Take 1 tablet by mouth daily.    . Omega-3 Fatty Acids (FISH OIL BURP-LESS PO) Take 1 capsule by mouth daily.    . Phentermine-Topiramate 3.75-23 MG CP24 Take 1 tablet by mouth every morning. 14 capsule 0  . [START ON 08/11/2019] Phentermine-Topiramate 7.5-46 MG CP24 Take 1 tablet by mouth every morning. 30 capsule 0   No facility-administered medications prior to visit.    No Known Allergies  ROS Review of Systems    Objective:    Physical Exam  There were no vitals taken for this visit. Wt Readings from Last 3 Encounters:  07/28/19 210 lb (95.3 kg)  07/03/19 213 lb 12.8 oz (97 kg)  06/05/19  210 lb 11.2 oz (95.6 kg)     Health Maintenance Due  Topic Date Due  . PAP SMEAR-Modifier  07/03/2014    There are no preventive care reminders to display for this patient.  Lab Results  Component Value Date   TSH 2.610 09/08/2007   Lab Results  Component Value Date   WBC 10.3 05/22/2011   HGB 11.5 (L) 07/03/2011   HCT 21.6 (L) 05/22/2011   MCV 86.1 05/22/2011   PLT 181 05/22/2011   Lab Results  Component Value Date   NA 135 05/18/2011   K 4.3 05/18/2011   CO2 24 05/18/2011   GLUCOSE 80 05/18/2011   BUN 8 05/18/2011   CREATININE 0.69 05/18/2011   BILITOT 0.2 (L) 05/18/2011   ALKPHOS 159 (H) 05/18/2011   AST 23 05/18/2011   ALT 12 05/18/2011   PROT 6.8 05/18/2011   ALBUMIN 2.8 (L) 05/18/2011   CALCIUM 9.4 05/18/2011   Lab Results  Component Value Date   CHOL 187 03/07/2010   Lab Results  Component Value Date   HDL 52 03/07/2010   Lab Results  Component Value Date   LDLCALC 122 (H) 03/07/2010   Lab Results  Component Value Date   TRIG 66 03/07/2010   Lab Results  Component Value Date   CHOLHDL 3.6 Ratio 03/07/2010   No results found for: HGBA1C    Assessment & Plan:  Pregnancy test negative. Need to proceed with PA for Qysmia.    Problem List Items Addressed This Visit    None    Visit Diagnoses    Pregnancy test negative    -  Primary      No orders of the defined types were placed in this encounter.   Follow-up: No follow-ups on file.    Esmond Harps, CMA

## 2019-08-08 NOTE — Progress Notes (Signed)
I have faxed the negative pregnancy test to insurance.  Waiting on response.

## 2019-08-09 NOTE — Progress Notes (Signed)
Received a fax from [facility] that Qsymia was approved from 08/07/2019 through 10/30/2019. Pharmacy aware and form sent to scan.

## 2019-08-24 ENCOUNTER — Telehealth: Payer: Self-pay

## 2019-08-24 NOTE — Telephone Encounter (Signed)
Pt was started on Qsymia 3.75 and was told to increase the dose to 7.5 after a set period of time. Pt states that she is going to be increasing the dose tomorrow but her insurance is requiring a PA. She said her pharmacy faxed over a request for this. Have you received it yet?

## 2019-08-25 ENCOUNTER — Ambulatory Visit: Payer: Commercial Managed Care - PPO | Admitting: Medical-Surgical

## 2019-08-30 NOTE — Telephone Encounter (Signed)
Received fax that PA authorized through 10/30/19  Auth sent to pharmacy and asked to re-run RX and contact patient  Auth also sent to scan

## 2019-09-07 ENCOUNTER — Ambulatory Visit (INDEPENDENT_AMBULATORY_CARE_PROVIDER_SITE_OTHER): Payer: Commercial Managed Care - PPO | Admitting: Medical-Surgical

## 2019-09-07 ENCOUNTER — Other Ambulatory Visit: Payer: Self-pay

## 2019-09-07 ENCOUNTER — Encounter: Payer: Self-pay | Admitting: Medical-Surgical

## 2019-09-07 VITALS — BP 116/77 | HR 67 | Temp 98.1°F | Wt 210.0 lb

## 2019-09-07 DIAGNOSIS — Z6836 Body mass index (BMI) 36.0-36.9, adult: Secondary | ICD-10-CM | POA: Diagnosis not present

## 2019-09-07 MED ORDER — PHENTERMINE-TOPIRAMATE ER 7.5-46 MG PO CP24: 1.0000 | ORAL_CAPSULE | Freq: Every morning | ORAL | 0 refills | Status: DC

## 2019-09-07 NOTE — Patient Instructions (Signed)
PowerCrunch Bars- good, quick source of protein

## 2019-09-07 NOTE — Progress Notes (Signed)
Subjective:    CC: weight check  HPI: Pleasant 34 year old female presenting today for weight check on Qsymia.  Had difficulty with her insurance leading to starting the Qsymia late.  She has been on the 7.5-46 mg dose for 2 weeks today.  Tolerating the medication well and has noticed a decrease in her cravings.  Her appetite is well controlled and she notes that there are some changes in her taste.  She has been drinking a lot more water recently.  Continues to go to the gym 2-3 times a week and walks on other days.  Has restricted her caloric intake to approximately 1200/day.  She does endorse struggling to get enough protein in her diet.  Often has Nestl protein chocolate milk with 26 g of protein in it for breakfast.  For lunch and dinner she does do some sort of meat or cheese.  Weighs herself every 2 to 3 days.  Admits to being worried about the next few weeks as the kids start back to school and it gets a lot busier around the house.  Is currently making plans to be able to avoid disruption in her current dietary routine.  Denies fever, chills, shortness of breath, palpitations, headaches, dizziness, and GI upset.  I reviewed the past medical history, family history, social history, surgical history, and allergies today and no changes were needed.  Please see the problem list section below in epic for further details.  Past Medical History: Past Medical History:  Diagnosis Date  . PCOS (polycystic ovarian syndrome)    Past Surgical History: Past Surgical History:  Procedure Laterality Date  . ANTERIOR CRUCIATE LIGAMENT REPAIR  2002  . CESAREAN SECTION  05/21/2011   Procedure: CESAREAN SECTION;  Surgeon: Tilda Burrow, MD;  Location: [facility];  Service: Gynecology;  Laterality: N/A;  Primary Cesarean Section Delivery Baby Girl @ 0400, Apgars 9/9   Social History: Social History   Socioeconomic History  . Marital status: Married    Spouse name: Not on file  . Number of children: Not  on file  . Years of education: Not on file  . Highest education level: Not on file  Occupational History  . Occupation: Data processing manager  Tobacco Use  . Smoking status: Never Smoker  . Smokeless tobacco: Never Used  Vaping Use  . Vaping Use: Never used  Substance and Sexual Activity  . Alcohol use: Yes    Alcohol/week: 3.0 standard drinks    Types: 3 Glasses of wine per week    Comment: 1-2 times weekly  . Drug use: No  . Sexual activity: Yes    Partners: Male    Birth control/protection: I.U.D.  Other Topics Concern  . Not on file  Social History Narrative  . Not on file   Social Determinants of Health   Financial Resource Strain:   . Difficulty of Paying Living Expenses: Not on file  Food Insecurity:   . Worried About Programme researcher, broadcasting/film/video in the Last Year: Not on file  . Ran Out of Food in the Last Year: Not on file  Transportation Needs:   . Lack of Transportation (Medical): Not on file  . Lack of Transportation (Non-Medical): Not on file  Physical Activity:   . Days of Exercise per Week: Not on file  . Minutes of Exercise per Session: Not on file  Stress:   . Feeling of Stress : Not on file  Social Connections:   . Frequency of Communication with Friends and  Family: Not on file  . Frequency of Social Gatherings with Friends and Family: Not on file  . Attends Religious Services: Not on file  . Active Member of Clubs or Organizations: Not on file  . Attends Banker Meetings: Not on file  . Marital Status: Not on file   Family History: Family History  Problem Relation Age of Onset  . Diabetes Father   . Prostate cancer Father   . Anesthesia problems Neg Hx    Allergies: No Known Allergies Medications: See med rec.  Review of Systems: See HPI for pertinent positives and negatives.   Objective:    General: Well Developed, well nourished, and in no acute distress.  Neuro: Alert and oriented x3.  HEENT: Normocephalic, atraumatic.  Skin: Warm  and dry. Cardiac: Regular rate and rhythm, no murmurs rubs or gallops, no lower extremity edema.  Respiratory: Clear to auscultation bilaterally. Not using accessory muscles, speaking in full sentences.  Impression and Recommendations:    1. BMI 36.0-36.9,adult Weight today 210, no weight loss but also no gain.  With interruption in medication schedule, this is to be expected.  Doing well overall on her diet and exercise.  Discussed increasing protein and options to help her with that.  May benefit from meal prepping especially during the busy time coming up.  We will continue Qsymia at the current dose for another 4 weeks.  Refill sent to pharmacy. - Phentermine-Topiramate 7.5-46 MG CP24; Take 1 tablet by mouth every morning.  Dispense: 30 capsule; Refill: 0  Return in about 4 weeks (around 10/05/2019) for weight check. ___________________________________________ Thayer Ohm, DNP, APRN, FNP-BC Primary Care and Sports Medicine [facility] [place]

## 2019-09-12 ENCOUNTER — Encounter: Payer: Commercial Managed Care - PPO | Admitting: Medical-Surgical

## 2019-09-12 ENCOUNTER — Other Ambulatory Visit: Payer: Self-pay | Admitting: Medical-Surgical

## 2019-09-12 DIAGNOSIS — Z0000 Encounter for general adult medical examination without abnormal findings: Secondary | ICD-10-CM

## 2019-09-12 DIAGNOSIS — R635 Abnormal weight gain: Secondary | ICD-10-CM

## 2019-09-12 NOTE — Progress Notes (Deleted)
HPI: Erin Stone is a 34 y.o. female who  has a past medical history of PCOS (polycystic ovarian syndrome).  she presents to [facility] today, 09/12/19,  for chief complaint of:  Annual physical exam  Dentist: Eye exam: Diet: Exercise:  Concerns:   Past medical, surgical, social and family history reviewed:  Patient Active Problem List   Diagnosis Date Noted  . Suspected fetal anomaly, antepartum 02/22/2011  . ACNE VULGARIS 09/08/2007  . WEIGHT GAIN, ABNORMAL 09/08/2007    Past Surgical History:  Procedure Laterality Date  . ANTERIOR CRUCIATE LIGAMENT REPAIR  2002  . CESAREAN SECTION  05/21/2011   Procedure: CESAREAN SECTION;  Surgeon: Tilda Burrow, MD;  Location: [facility];  Service: Gynecology;  Laterality: N/A;  Primary Cesarean Section Delivery Baby Girl @ 0400, Apgars 9/9    Social History   Tobacco Use  . Smoking status: Never Smoker  . Smokeless tobacco: Never Used  Substance Use Topics  . Alcohol use: Yes    Alcohol/week: 3.0 standard drinks    Types: 3 Glasses of wine per week    Comment: 1-2 times weekly    Family History  Problem Relation Age of Onset  . Diabetes Father   . Prostate cancer Father   . Anesthesia problems Neg Hx      Current medication list and allergy/intolerance information reviewed:    Current Outpatient Medications  Medication Sig Dispense Refill  . Cyanocobalamin (B-12 PO) Take 1 tablet by mouth daily.    Marland Kitchen levonorgestrel (MIRENA) 20 MCG/24HR IUD 1 each by Intrauterine route once.    Marland Kitchen MELATONIN PO Take 1 capsule by mouth at bedtime as needed.    . Multiple Vitamin (MULTIVITAMIN) tablet Take 1 tablet by mouth daily.    . Nutritional Supplements (VITAMIN D BOOSTER PO) Take 1 tablet by mouth daily.    . Omega-3 Fatty Acids (FISH OIL BURP-LESS PO) Take 1 capsule by mouth daily.    Melene Muller ON 09/21/2019] Phentermine-Topiramate 7.5-46 MG CP24 Take 1 tablet by mouth every morning. 30 capsule 0    No current facility-administered medications for this visit.    No Known Allergies    Review of Systems:  Constitutional:  No  fever, no chills, No recent illness, No unintentional weight changes. No significant fatigue.   HEENT: No  headache, no vision change, no hearing change, No sore throat, No  sinus pressure  Cardiac: No  chest pain, No  pressure, No palpitations, No  Orthopnea  Respiratory:  No  shortness of breath. No  Cough  Gastrointestinal: No  abdominal pain, No  nausea, No  vomiting,  No  blood in stool, No  diarrhea, No  constipation   Musculoskeletal: No new myalgia/arthralgia  Skin: No  Rash, No other wounds/concerning lesions  Genitourinary: No  incontinence, No  abnormal genital bleeding, No abnormal genital discharge  Hem/Onc: No  easy bruising/bleeding, No  abnormal lymph node  Endocrine: No cold intolerance,  No heat intolerance. No polyuria/polydipsia/polyphagia   Neurologic: No  weakness, No  dizziness, No  slurred speech/focal weakness/facial droop  Psychiatric: No  concerns with depression, No  concerns with anxiety, No sleep problems, No mood problems  Exam:  There were no vitals taken for this visit.  Constitutional: VS see above. General Appearance: alert, well-developed, well-nourished, NAD  Eyes: Normal lids and conjunctive, non-icteric sclera  Ears, Nose, Mouth, Throat: MMM, Normal external inspection ears/nares/mouth/lips/gums. TM normal bilaterally. Pharynx/tonsils no erythema, no exudate. Nasal mucosa  normal.   Neck: No masses, trachea midline. No thyroid enlargement. No tenderness/mass appreciated. No lymphadenopathy  Respiratory: Normal respiratory effort. no wheeze, no rhonchi, no rales  Cardiovascular: S1/S2 normal, no murmur, no rub/gallop auscultated. RRR. No lower extremity edema. Pedal pulse II/IV bilaterally DP and PT. No carotid bruit or JVD. No abdominal aortic bruit.  Gastrointestinal: Nontender, no masses. No  hepatomegaly, no splenomegaly. No hernia appreciated. Bowel sounds normal. Rectal exam deferred.   Musculoskeletal: Gait normal. No clubbing/cyanosis of digits.   Neurological: Normal balance/coordination. No tremor. No cranial nerve deficit on limited exam. Motor and sensation intact and symmetric. Cerebellar reflexes intact.   Skin: warm, dry, intact. No rash/ulcer. No concerning nevi or subq nodules on limited exam.    Psychiatric: Normal judgment/insight. Normal mood and affect. Oriented x3.    No results found for this or any previous visit (from the past 72 hour(s)).  No results found.   ASSESSMENT/PLAN:   1. Annual physical exam ***   No orders of the defined types were placed in this encounter.   No orders of the defined types were placed in this encounter.   There are no Patient Instructions on file for this visit.  Follow-up plan: No follow-ups on file.  Thayer Ohm, DNP, APRN, FNP-BC [facility]

## 2019-09-12 NOTE — Progress Notes (Signed)
Entering orders for labs prior to physical.

## 2019-09-13 ENCOUNTER — Ambulatory Visit (INDEPENDENT_AMBULATORY_CARE_PROVIDER_SITE_OTHER): Payer: Commercial Managed Care - PPO | Admitting: Medical-Surgical

## 2019-09-13 ENCOUNTER — Encounter: Payer: Self-pay | Admitting: Medical-Surgical

## 2019-09-13 VITALS — BP 126/88 | HR 71 | Temp 97.9°F | Ht 65.0 in | Wt 209.1 lb

## 2019-09-13 DIAGNOSIS — Z23 Encounter for immunization: Secondary | ICD-10-CM | POA: Diagnosis not present

## 2019-09-13 DIAGNOSIS — H6122 Impacted cerumen, left ear: Secondary | ICD-10-CM

## 2019-09-13 DIAGNOSIS — Z0000 Encounter for general adult medical examination without abnormal findings: Secondary | ICD-10-CM | POA: Diagnosis not present

## 2019-09-13 NOTE — Patient Instructions (Signed)
Preventive Care 21-33 Years Old, Female Preventive care refers to visits with your health care provider and lifestyle choices that can promote health and wellness. This includes:  A yearly physical exam. This may also be called an annual well check.  Regular dental visits and eye exams.  Immunizations.  Screening for certain conditions.  Healthy lifestyle choices, such as eating a healthy diet, getting regular exercise, not using drugs or products that contain nicotine and tobacco, and limiting alcohol use. What can I expect for my preventive care visit? Physical exam Your health care provider will check your:  Height and weight. This may be used to calculate body mass index (BMI), which tells if you are at a healthy weight.  Heart rate and blood pressure.  Skin for abnormal spots. Counseling Your health care provider may ask you questions about your:  Alcohol, tobacco, and drug use.  Emotional well-being.  Home and relationship well-being.  Sexual activity.  Eating habits.  Work and work environment.  Method of birth control.  Menstrual cycle.  Pregnancy history. What immunizations do I need?  Influenza (flu) vaccine  This is recommended every year. Tetanus, diphtheria, and pertussis (Tdap) vaccine  You may need a Td booster every 10 years. Varicella (chickenpox) vaccine  You may need this if you have not been vaccinated. Human papillomavirus (HPV) vaccine  If recommended by your health care provider, you may need three doses over 6 months. Measles, mumps, and rubella (MMR) vaccine  You may need at least one dose of MMR. You may also need a second dose. Meningococcal conjugate (MenACWY) vaccine  One dose is recommended if you are age 19-21 years and a first-year college student living in a residence hall, or if you have one of several medical conditions. You may also need additional booster doses. Pneumococcal conjugate (PCV13) vaccine  You may need  this if you have certain conditions and were not previously vaccinated. Pneumococcal polysaccharide (PPSV23) vaccine  You may need one or two doses if you smoke cigarettes or if you have certain conditions. Hepatitis A vaccine  You may need this if you have certain conditions or if you travel or work in places where you may be exposed to hepatitis A. Hepatitis B vaccine  You may need this if you have certain conditions or if you travel or work in places where you may be exposed to hepatitis B. Haemophilus influenzae type b (Hib) vaccine  You may need this if you have certain conditions. You may receive vaccines as individual doses or as more than one vaccine together in one shot (combination vaccines). Talk with your health care provider about the risks and benefits of combination vaccines. What tests do I need?  Blood tests  Lipid and cholesterol levels. These may be checked every 5 years starting at age 20.  Hepatitis C test.  Hepatitis B test. Screening  Diabetes screening. This is done by checking your blood sugar (glucose) after you have not eaten for a while (fasting).  Sexually transmitted disease (STD) testing.  BRCA-related cancer screening. This may be done if you have a family history of breast, ovarian, tubal, or peritoneal cancers.  Pelvic exam and Pap test. This may be done every 3 years starting at age 21. Starting at age 30, this may be done every 5 years if you have a Pap test in combination with an HPV test. Talk with your health care provider about your test results, treatment options, and if necessary, the need for more tests.   Follow these instructions at home: Eating and drinking   Eat a diet that includes fresh fruits and vegetables, whole grains, lean protein, and low-fat dairy.  Take vitamin and mineral supplements as recommended by your health care provider.  Do not drink alcohol if: ? Your health care provider tells you not to drink. ? You are  pregnant, may be pregnant, or are planning to become pregnant.  If you drink alcohol: ? Limit how much you have to 0-1 drink a day. ? Be aware of how much alcohol is in your drink. In the [place] one drink equals one 12 oz bottle of beer (355 mL), one 5 oz glass of wine (148 mL), or one 1 oz glass of hard liquor (44 mL). Lifestyle  Take daily care of your teeth and gums.  Stay active. Exercise for at least 30 minutes on 5 or more days each week.  Do not use any products that contain nicotine or tobacco, such as cigarettes, e-cigarettes, and chewing tobacco. If you need help quitting, ask your health care provider.  If you are sexually active, practice safe sex. Use a condom or other form of birth control (contraception) in order to prevent pregnancy and STIs (sexually transmitted infections). If you plan to become pregnant, see your health care provider for a preconception visit. What's next?  Visit your health care provider once a year for a well check visit.  Ask your health care provider how often you should have your eyes and teeth checked.  Stay up to date on all vaccines. This information is not intended to replace advice given to you by your health care provider. Make sure you discuss any questions you have with your health care provider. Document Revised: 09/16/2017 Document Reviewed: 09/16/2017 Elsevier Patient Education  2020 [employer].

## 2019-09-13 NOTE — Progress Notes (Signed)
HPI: Erin Stone is a 34 y.o. female who  has a past medical history of PCOS (polycystic ovarian syndrome).  she presents to [facility] today, 09/13/19,  for chief complaint of: Annual physical exam  Dentist- every 6 months, no concerns Eye exam- last in 2018-2019, no corrections Exercise- regular exercise, followed during weight check appointments Diet- currently being followed for weight loss every month  Concerns: None  Past medical, surgical, social and family history reviewed:  Patient Active Problem List   Diagnosis Date Noted  . Suspected fetal anomaly, antepartum 02/22/2011  . ACNE VULGARIS 09/08/2007  . WEIGHT GAIN, ABNORMAL 09/08/2007    Past Surgical History:  Procedure Laterality Date  . ANTERIOR CRUCIATE LIGAMENT REPAIR  2002  . CESAREAN SECTION  05/21/2011   Procedure: CESAREAN SECTION;  Surgeon: Jonnie Kind, MD;  Location: [facility];  Service: Gynecology;  Laterality: N/A;  Primary Cesarean Section Delivery Baby Girl @ 0400, Apgars 9/9    Social History   Tobacco Use  . Smoking status: Never Smoker  . Smokeless tobacco: Never Used  Substance Use Topics  . Alcohol use: Yes    Alcohol/week: 3.0 standard drinks    Types: 3 Glasses of wine per week    Comment: 1-2 times weekly    Family History  Problem Relation Age of Onset  . Cervical polyp Mother   . Diabetes Father   . Prostate cancer Father   . Anesthesia problems Neg Hx      Current medication list and allergy/intolerance information reviewed:    Current Outpatient Medications  Medication Sig Dispense Refill  . Cyanocobalamin (B-12 PO) Take 1 tablet by mouth daily.    Marland Kitchen levonorgestrel (MIRENA) 20 MCG/24HR IUD 1 each by Intrauterine route once.    Marland Kitchen MELATONIN PO Take 1 capsule by mouth at bedtime as needed.    . Multiple Vitamin (MULTIVITAMIN) tablet Take 1 tablet by mouth daily.    . Nutritional Supplements (VITAMIN D BOOSTER PO) Take 1 tablet by mouth  daily.    . Omega-3 Fatty Acids (FISH OIL BURP-LESS PO) Take 1 capsule by mouth daily.    Derrill Memo ON 09/21/2019] Phentermine-Topiramate 7.5-46 MG CP24 Take 1 tablet by mouth every morning. 30 capsule 0   No current facility-administered medications for this visit.    No Known Allergies    Review of Systems:  Constitutional:  No  fever, no chills, No recent illness, No unintentional weight changes. No significant fatigue.   HEENT: No  headache, no vision change, no hearing change, No sore throat, No  sinus pressure  Cardiac: No  chest pain, No  pressure, No palpitations, No  Orthopnea  Respiratory:  No  shortness of breath. No  Cough  Gastrointestinal: No  abdominal pain, No  nausea, No  vomiting,  No  blood in stool, No  diarrhea, No  constipation   Musculoskeletal: No new myalgia/arthralgia  Skin: No  Rash, No other wounds/concerning lesions  Genitourinary: No  incontinence, No  abnormal genital bleeding, No abnormal genital discharge  Hem/Onc: No  easy bruising/bleeding, No  abnormal lymph node  Endocrine: No cold intolerance,  No heat intolerance. No polyuria/polydipsia/polyphagia   Neurologic: No  weakness, No  dizziness, No  slurred speech/focal weakness/facial droop  Psychiatric: No  concerns with depression, No  concerns with anxiety, No sleep problems, No mood problems  Exam:  BP 126/88   Pulse 71   Temp 97.9 F (36.6 C) (Oral)   Ht  5' 5"  (1.651 m)   Wt 209 lb 1.6 oz (94.8 kg)   SpO2 99%   BMI 34.80 kg/m   Constitutional: VS see above. General Appearance: alert, well-developed, well-nourished, NAD  Eyes: Normal lids and conjunctive, non-icteric sclera  Ears, Nose, Mouth, Throat: MMM, Normal external inspection ears/nares/mouth/lips/gums. TM normal bilaterally. Pharynx/tonsils no erythema, no exudate. Nasal mucosa normal.   Neck: No masses, trachea midline. No thyroid enlargement. No tenderness/mass appreciated. No lymphadenopathy  Respiratory: Normal  respiratory effort. no wheeze, no rhonchi, no rales  Cardiovascular: S1/S2 normal, no murmur, no rub/gallop auscultated. RRR. No lower extremity edema. Pedal pulse II/IV bilaterally DP and PT. No carotid bruit or JVD. No abdominal aortic bruit.  Gastrointestinal: Nontender, no masses. No hepatomegaly, no splenomegaly. No hernia appreciated. Bowel sounds normal. Rectal exam deferred.   Musculoskeletal: Gait normal. No clubbing/cyanosis of digits.   Neurological: Normal balance/coordination. No tremor. No cranial nerve deficit on limited exam. Motor and sensation intact and symmetric. Cerebellar reflexes intact.   Skin: warm, dry, intact. No rash/ulcer. No concerning nevi or subq nodules on limited exam.    Psychiatric: Normal judgment/insight. Normal mood and affect. Oriented x3.    No results found for this or any previous visit (from the past 72 hour(s)).  No results found.   ASSESSMENT/PLAN:   1. Annual physical exam Labs drawn this morning for CBC, CMP, and Lipid panel. Requesting records from [facility] for most recent pap smear.  2. Impacted cerumen of left ear Irrigated in office. Recommend Debrox ear drops for wax softening as needed.  3. Need for influenza vaccination Flu shot given in office by MA. - Flu Vaccine QUAD 36+ mos IM  Orders Placed This Encounter  Procedures  . Flu Vaccine QUAD 36+ mos IM    No orders of the defined types were placed in this encounter.   Patient Instructions  Preventive Care 64-34 Years Old, Female Preventive care refers to visits with your health care provider and lifestyle choices that can promote health and wellness. This includes:  A yearly physical exam. This may also be called an annual well check.  Regular dental visits and eye exams.  Immunizations.  Screening for certain conditions.  Healthy lifestyle choices, such as eating a healthy diet, getting regular exercise, not using drugs or products that contain  nicotine and tobacco, and limiting alcohol use. What can I expect for my preventive care visit? Physical exam Your health care provider will check your:  Height and weight. This may be used to calculate body mass index (BMI), which tells if you are at a healthy weight.  Heart rate and blood pressure.  Skin for abnormal spots. Counseling Your health care provider may ask you questions about your:  Alcohol, tobacco, and drug use.  Emotional well-being.  Home and relationship well-being.  Sexual activity.  Eating habits.  Work and work Statistician.  Method of birth control.  Menstrual cycle.  Pregnancy history. What immunizations do I need?  Influenza (flu) vaccine  This is recommended every year. Tetanus, diphtheria, and pertussis (Tdap) vaccine  You may need a Td booster every 10 years. Varicella (chickenpox) vaccine  You may need this if you have not been vaccinated. Human papillomavirus (HPV) vaccine  If recommended by your health care provider, you may need three doses over 6 months. Measles, mumps, and rubella (MMR) vaccine  You may need at least one dose of MMR. You may also need a second dose. Meningococcal conjugate (MenACWY) vaccine  One dose is  recommended if you are age 12-21 years and a first-year college student living in a residence hall, or if you have one of several medical conditions. You may also need additional booster doses. Pneumococcal conjugate (PCV13) vaccine  You may need this if you have certain conditions and were not previously vaccinated. Pneumococcal polysaccharide (PPSV23) vaccine  You may need one or two doses if you smoke cigarettes or if you have certain conditions. Hepatitis A vaccine  You may need this if you have certain conditions or if you travel or work in places where you may be exposed to hepatitis A. Hepatitis B vaccine  You may need this if you have certain conditions or if you travel or work in places where you  may be exposed to hepatitis B. Haemophilus influenzae type b (Hib) vaccine  You may need this if you have certain conditions. You may receive vaccines as individual doses or as more than one vaccine together in one shot (combination vaccines). Talk with your health care provider about the risks and benefits of combination vaccines. What tests do I need?  Blood tests  Lipid and cholesterol levels. These may be checked every 5 years starting at age 42.  Hepatitis C test.  Hepatitis B test. Screening  Diabetes screening. This is done by checking your blood sugar (glucose) after you have not eaten for a while (fasting).  Sexually transmitted disease (STD) testing.  BRCA-related cancer screening. This may be done if you have a family history of breast, ovarian, tubal, or peritoneal cancers.  Pelvic exam and Pap test. This may be done every 3 years starting at age 78. Starting at age 16, this may be done every 5 years if you have a Pap test in combination with an HPV test. Talk with your health care provider about your test results, treatment options, and if necessary, the need for more tests. Follow these instructions at home: Eating and drinking   Eat a diet that includes fresh fruits and vegetables, whole grains, lean protein, and low-fat dairy.  Take vitamin and mineral supplements as recommended by your health care provider.  Do not drink alcohol if: ? Your health care provider tells you not to drink. ? You are pregnant, may be pregnant, or are planning to become pregnant.  If you drink alcohol: ? Limit how much you have to 0-1 drink a day. ? Be aware of how much alcohol is in your drink. In the [place] one drink equals one 12 oz bottle of beer (355 mL), one 5 oz glass of wine (148 mL), or one 1 oz glass of hard liquor (44 mL). Lifestyle  Take daily care of your teeth and gums.  Stay active. Exercise for at least 30 minutes on 5 or more days each week.  Do not use any  products that contain nicotine or tobacco, such as cigarettes, e-cigarettes, and chewing tobacco. If you need help quitting, ask your health care provider.  If you are sexually active, practice safe sex. Use a condom or other form of birth control (contraception) in order to prevent pregnancy and STIs (sexually transmitted infections). If you plan to become pregnant, see your health care provider for a preconception visit. What's next?  Visit your health care provider once a year for a well check visit.  Ask your health care provider how often you should have your eyes and teeth checked.  Stay up to date on all vaccines. This information is not intended to replace advice given to you  by your health care provider. Make sure you discuss any questions you have with your health care provider. Document Revised: 09/16/2017 Document Reviewed: 09/16/2017 Elsevier Patient Education  [employer].  Follow-up plan: Return in about 1 year (around 09/12/2020) for next annual physical exam; as directed for next weight check.  Clearnce Sorrel, DNP, APRN, FNP-BC [facility]

## 2019-09-14 LAB — CBC
HCT: 46.4 % — ABNORMAL HIGH (ref 35.0–45.0)
Hemoglobin: 15.6 g/dL — ABNORMAL HIGH (ref 11.7–15.5)
MCH: 31.1 pg (ref 27.0–33.0)
MCHC: 33.6 g/dL (ref 32.0–36.0)
MCV: 92.4 fL (ref 80.0–100.0)
MPV: 10.6 fL (ref 7.5–12.5)
Platelets: 262 10*3/uL (ref 140–400)
RBC: 5.02 10*6/uL (ref 3.80–5.10)
RDW: 12 % (ref 11.0–15.0)
WBC: 5.3 10*3/uL (ref 3.8–10.8)

## 2019-09-14 LAB — COMPLETE METABOLIC PANEL WITH GFR
AG Ratio: 2 (calc) (ref 1.0–2.5)
ALT: 18 U/L (ref 6–29)
AST: 16 U/L (ref 10–30)
Albumin: 4.7 g/dL (ref 3.6–5.1)
Alkaline phosphatase (APISO): 66 U/L (ref 31–125)
BUN: 17 mg/dL (ref 7–25)
CO2: 25 mmol/L (ref 20–32)
Calcium: 9.5 mg/dL (ref 8.6–10.2)
Chloride: 106 mmol/L (ref 98–110)
Creat: 1.09 mg/dL (ref 0.50–1.10)
GFR, Est African American: 77 mL/min/{1.73_m2} (ref >=60)
GFR, Est Non African American: 67 mL/min/{1.73_m2} (ref >=60)
Globulin: 2.4 g/dL (calc) (ref 1.9–3.7)
Glucose, Bld: 87 mg/dL (ref 65–99)
Potassium: 4.1 mmol/L (ref 3.5–5.3)
Sodium: 138 mmol/L (ref 135–146)
Total Bilirubin: 0.6 mg/dL (ref 0.2–1.2)
Total Protein: 7.1 g/dL (ref 6.1–8.1)

## 2019-09-14 LAB — LIPID PANEL
Cholesterol: 165 mg/dL (ref <200)
HDL: 44 mg/dL — ABNORMAL LOW (ref >=50)
LDL Cholesterol (Calc): 98 mg/dL (calc)
Non-HDL Cholesterol (Calc): 121 mg/dL (calc) (ref <130)
Total CHOL/HDL Ratio: 3.8 (calc) (ref <5.0)
Triglycerides: 136 mg/dL (ref <150)

## 2019-09-14 LAB — TSH: TSH: 2.25 mIU/L

## 2019-10-05 ENCOUNTER — Other Ambulatory Visit: Payer: Self-pay

## 2019-10-05 ENCOUNTER — Encounter: Payer: Self-pay | Admitting: Medical-Surgical

## 2019-10-05 ENCOUNTER — Ambulatory Visit (INDEPENDENT_AMBULATORY_CARE_PROVIDER_SITE_OTHER): Payer: Commercial Managed Care - PPO | Admitting: Medical-Surgical

## 2019-10-05 VITALS — BP 137/84 | HR 72 | Temp 98.0°F | Ht 65.0 in | Wt 209.5 lb

## 2019-10-05 DIAGNOSIS — E6609 Other obesity due to excess calories: Secondary | ICD-10-CM

## 2019-10-05 DIAGNOSIS — Z6834 Body mass index (BMI) 34.0-34.9, adult: Secondary | ICD-10-CM

## 2019-10-05 NOTE — Progress Notes (Signed)
Subjective:    CC: Weight check  HPI: Pleasant 34 year old female presenting today for weight check on Qsymia.  Feels that the medication helps with her appetite and controls her cravings for specific foods. She continues to exercise regularly and monitor her dietary intake.  She reports approximately 1200 cal over 3 meals every day.  Usually eats a smaller breakfast with a normal size lunch and a larger dinner.  Has tried to increase her protein intake over the last few weeks.  Drinking at least two 64 ounce bottles of water every day.  Sleeping well overall.  Denies chest pain, shortness of breath, palpitations, headaches, dizziness, and GI symptoms.   I reviewed the past medical history, family history, social history, surgical history, and allergies today and no changes were needed.  Please see the problem list section below in epic for further details.  Past Medical History: Past Medical History:  Diagnosis Date  . PCOS (polycystic ovarian syndrome)    Past Surgical History: Past Surgical History:  Procedure Laterality Date  . ANTERIOR CRUCIATE LIGAMENT REPAIR  2002  . CESAREAN SECTION  05/21/2011   Procedure: CESAREAN SECTION;  Surgeon: Tilda Burrow, MD;  Location: [facility];  Service: Gynecology;  Laterality: N/A;  Primary Cesarean Section Delivery Baby Girl @ 0400, Apgars 9/9   Social History: Social History   Socioeconomic History  . Marital status: Married    Spouse name: Not on file  . Number of children: Not on file  . Years of education: Not on file  . Highest education level: Not on file  Occupational History  . Occupation: Data processing manager  Tobacco Use  . Smoking status: Never Smoker  . Smokeless tobacco: Never Used  Vaping Use  . Vaping Use: Never used  Substance and Sexual Activity  . Alcohol use: Yes    Alcohol/week: 3.0 standard drinks    Types: 3 Glasses of wine per week    Comment: 1-2 times weekly  . Drug use: No  . Sexual activity: Yes    Partners:  Male    Birth control/protection: I.U.D.  Other Topics Concern  . Not on file  Social History Narrative  . Not on file   Social Determinants of Health   Financial Resource Strain:   . Difficulty of Paying Living Expenses: Not on file  Food Insecurity:   . Worried About Programme researcher, broadcasting/film/video in the Last Year: Not on file  . Ran Out of Food in the Last Year: Not on file  Transportation Needs:   . Lack of Transportation (Medical): Not on file  . Lack of Transportation (Non-Medical): Not on file  Physical Activity:   . Days of Exercise per Week: Not on file  . Minutes of Exercise per Session: Not on file  Stress:   . Feeling of Stress : Not on file  Social Connections:   . Frequency of Communication with Friends and Family: Not on file  . Frequency of Social Gatherings with Friends and Family: Not on file  . Attends Religious Services: Not on file  . Active Member of Clubs or Organizations: Not on file  . Attends Banker Meetings: Not on file  . Marital Status: Not on file   Family History: Family History  Problem Relation Age of Onset  . Cervical polyp Mother   . Diabetes Father   . Prostate cancer Father   . Anesthesia problems Neg Hx    Allergies: No Known Allergies Medications: See med rec.  Review of Systems: See HPI for pertinent positives and negatives.   Objective:    General: Well Developed, well nourished, and in no acute distress.  Neuro: Alert and oriented x3.  HEENT: Normocephalic, atraumatic.  Skin: Warm and dry. Cardiac: Regular rate and rhythm, no murmurs rubs or gallops, no lower extremity edema.  Respiratory: Clear to auscultation bilaterally. Not using accessory muscles, speaking in full sentences.   Impression and Recommendations:    1. Class 1 obesity due to excess calories without serious comorbidity with body mass index (BMI) of 34.0 to 34.9 in adult Continue current dose of Qsymia.  Recommend increasing breakfast and lunch  consumption while eating a smaller portion at dinner.  If unable to tolerate that, consider a small snack between lunch and dinner followed by a smaller dinner proportion.  Continue goal of 1200 cal/day.  Continue exercise.  At next evaluation, if no further weight loss, we will be increasing the dose of Qsymia to the next higher dose.  Return in about 4 weeks (around 11/02/2019) for weight check. ___________________________________________ Thayer Ohm, DNP, APRN, FNP-BC Primary Care and Sports Medicine [facility] [place]

## 2019-10-13 NOTE — Telephone Encounter (Signed)
Checked on cover my meds for determination and received approval for the 3.75-23 mg qsymia. - CF

## 2019-10-30 ENCOUNTER — Other Ambulatory Visit: Payer: Self-pay | Admitting: Medical-Surgical

## 2019-10-30 DIAGNOSIS — Z6836 Body mass index (BMI) 36.0-36.9, adult: Secondary | ICD-10-CM

## 2019-10-31 ENCOUNTER — Encounter: Payer: Self-pay | Admitting: Medical-Surgical

## 2019-10-31 NOTE — Telephone Encounter (Signed)
Appointment has been made. No further questions at this time.  

## 2019-11-08 NOTE — Progress Notes (Signed)
Subjective:    CC: weight check  HPI: Pleasant 34 year old female presenting for weight check on Qsymia. She has been at the 7.5/46mg  dose for 12 weeks and has lost a total of 4 lbs which equals 2% of her total body weight. She has been off the medication for 1 week due to a lapse between running out of the prescription and her appointment today. She has unfortunately gained 5lbs. Notes that when she took Qsymia before she had much better results and only had to do 3 months of the medication to reach her weight loss goal. She has not had such results this time. Feels that her clothes fit better overall but the numbers on the scale just don't seem to be moving. She is eating 1200-1500 calories daily but doesn't reliably keep a food diary. Exercising 2-3 times weekly. Sleeping well. Drinking plenty of water. Denies fever, chills, shortness of breath, chest pain, constipation, and nausea.  I reviewed the past medical history, family history, social history, surgical history, and allergies today and no changes were needed.  Please see the problem list section below in epic for further details.  Past Medical History: Past Medical History:  Diagnosis Date  . PCOS (polycystic ovarian syndrome)    Past Surgical History: Past Surgical History:  Procedure Laterality Date  . ANTERIOR CRUCIATE LIGAMENT REPAIR  2002  . CESAREAN SECTION  05/21/2011   Procedure: CESAREAN SECTION;  Surgeon: Tilda Burrow, MD;  Location: [facility];  Service: Gynecology;  Laterality: N/A;  Primary Cesarean Section Delivery Baby Girl @ 0400, Apgars 9/9   Social History: Social History   Socioeconomic History  . Marital status: Married    Spouse name: Not on file  . Number of children: Not on file  . Years of education: Not on file  . Highest education level: Not on file  Occupational History  . Occupation: Data processing manager  Tobacco Use  . Smoking status: Never Smoker  . Smokeless tobacco: Never Used  Vaping Use  .  Vaping Use: Never used  Substance and Sexual Activity  . Alcohol use: Yes    Alcohol/week: 3.0 standard drinks    Types: 3 Glasses of wine per week    Comment: 1-2 times weekly  . Drug use: No  . Sexual activity: Yes    Partners: Male    Birth control/protection: I.U.D.  Other Topics Concern  . Not on file  Social History Narrative  . Not on file   Social Determinants of Health   Financial Resource Strain:   . Difficulty of Paying Living Expenses: Not on file  Food Insecurity:   . Worried About Programme researcher, broadcasting/film/video in the Last Year: Not on file  . Ran Out of Food in the Last Year: Not on file  Transportation Needs:   . Lack of Transportation (Medical): Not on file  . Lack of Transportation (Non-Medical): Not on file  Physical Activity:   . Days of Exercise per Week: Not on file  . Minutes of Exercise per Session: Not on file  Stress:   . Feeling of Stress : Not on file  Social Connections:   . Frequency of Communication with Friends and Family: Not on file  . Frequency of Social Gatherings with Friends and Family: Not on file  . Attends Religious Services: Not on file  . Active Member of Clubs or Organizations: Not on file  . Attends Banker Meetings: Not on file  . Marital Status: Not on file  Family History: Family History  Problem Relation Age of Onset  . Cervical polyp Mother   . Diabetes Father   . Prostate cancer Father   . Anesthesia problems Neg Hx    Allergies: No Known Allergies Medications: See med rec.  Review of Systems: See HPI for pertinent positives and negatives.   Objective:    General: Well Developed, well nourished, and in no acute distress.  Neuro: Alert and oriented x3.  HEENT: Normocephalic, atraumatic.  Skin: Warm and dry. Cardiac: Regular rate and rhythm, no murmurs rubs or gallops, no lower extremity edema.  Respiratory: Clear to auscultation bilaterally. Not using accessory muscles, speaking in full  sentences.  Impression and Recommendations:    1. Class 2 obesity due to excess calories without serious comorbidity with body mass index (BMI) of 35.0 to 35.9 in adult Discontinue Qsymia. Starting [facility] 0.25mg  weekly with plan to taper to 2.4mg  weekly as tolerated. Discount card provided. Discussed medication administration instructions. Recommend maintaining calorie restriction and exercise.   Return in about 4 weeks (around 12/07/2019) for weight check. ___________________________________________ Thayer Ohm, DNP, APRN, FNP-BC Primary Care and Sports Medicine [facility] [place]

## 2019-11-09 ENCOUNTER — Ambulatory Visit (INDEPENDENT_AMBULATORY_CARE_PROVIDER_SITE_OTHER): Payer: Commercial Managed Care - PPO | Admitting: Medical-Surgical

## 2019-11-09 ENCOUNTER — Encounter: Payer: Self-pay | Admitting: Medical-Surgical

## 2019-11-09 ENCOUNTER — Other Ambulatory Visit: Payer: Self-pay

## 2019-11-09 VITALS — BP 140/78 | HR 74 | Temp 98.0°F | Ht 65.0 in | Wt 214.4 lb

## 2019-11-09 DIAGNOSIS — Z6835 Body mass index (BMI) 35.0-35.9, adult: Secondary | ICD-10-CM

## 2019-11-09 DIAGNOSIS — E6609 Other obesity due to excess calories: Secondary | ICD-10-CM

## 2019-11-09 MED ORDER — SEMAGLUTIDE-WEIGHT MANAGEMENT 1 MG/0.5ML ~~LOC~~ SOAJ: 1.0000 mg | SUBCUTANEOUS | 0 refills | Status: DC

## 2019-11-09 MED ORDER — SEMAGLUTIDE-WEIGHT MANAGEMENT 1.7 MG/0.75ML ~~LOC~~ SOAJ: 1.7000 mg | SUBCUTANEOUS | 0 refills | Status: AC

## 2019-11-09 MED ORDER — SEMAGLUTIDE-WEIGHT MANAGEMENT 0.25 MG/0.5ML ~~LOC~~ SOAJ: 0.2500 mg | SUBCUTANEOUS | 0 refills | Status: DC

## 2019-11-09 MED ORDER — SEMAGLUTIDE-WEIGHT MANAGEMENT 2.4 MG/0.75ML ~~LOC~~ SOAJ: 2.4000 mg | SUBCUTANEOUS | 1 refills | Status: DC

## 2019-11-09 MED ORDER — SEMAGLUTIDE-WEIGHT MANAGEMENT 0.5 MG/0.5ML ~~LOC~~ SOAJ: 0.5000 mg | SUBCUTANEOUS | 0 refills | Status: DC

## 2019-11-20 ENCOUNTER — Telehealth: Payer: Self-pay | Admitting: *Deleted

## 2019-11-20 MED ORDER — SEMAGLUTIDE-WEIGHT MANAGEMENT 2.4 MG/0.75ML ~~LOC~~ SOAJ: 2.4000 mg | SUBCUTANEOUS | 1 refills | Status: AC

## 2019-11-20 MED ORDER — SEMAGLUTIDE-WEIGHT MANAGEMENT 0.5 MG/0.5ML ~~LOC~~ SOAJ: 0.5000 mg | SUBCUTANEOUS | 0 refills | Status: AC

## 2019-11-20 MED ORDER — SEMAGLUTIDE-WEIGHT MANAGEMENT 1 MG/0.5ML ~~LOC~~ SOAJ: 1.0000 mg | SUBCUTANEOUS | 0 refills | Status: AC

## 2019-11-20 MED ORDER — SEMAGLUTIDE-WEIGHT MANAGEMENT 0.25 MG/0.5ML ~~LOC~~ SOAJ: 0.2500 mg | SUBCUTANEOUS | 0 refills | Status: AC

## 2019-11-20 NOTE — Telephone Encounter (Signed)
Pt called and would like her prescriptions for wegovy sent to [facility] of [facility].  [facility] 1.7mg  so I did not send that one for her.

## 2019-12-06 NOTE — Progress Notes (Signed)
Subjective:    CC: Weight check  HPI: Pleasant 34 year old female presenting for weight check. Was previously treated with Qsymia but was switched to [facility] at our last appointment. Has completed 3 injections of Wegovy 0.25mg  weekly. Tolerating well without side effects or injection site concerns. Feels that her appetite has reduced and she is not having any cravings. Does admit to struggling to get adequate water in daily. Just doesn't think about drinking water throughout the day and does not feel thirsty. Continues to exercise regularly.   I reviewed the past medical history, family history, social history, surgical history, and allergies today and no changes were needed.  Please see the problem list section below in epic for further details.  Past Medical History: Past Medical History:  Diagnosis Date  . PCOS (polycystic ovarian syndrome)    Past Surgical History: Past Surgical History:  Procedure Laterality Date  . ANTERIOR CRUCIATE LIGAMENT REPAIR  2002  . CESAREAN SECTION  05/21/2011   Procedure: CESAREAN SECTION;  Surgeon: Tilda Burrow, MD;  Location: [facility];  Service: Gynecology;  Laterality: N/A;  Primary Cesarean Section Delivery Baby Girl @ 0400, Apgars 9/9   Social History: Social History   Socioeconomic History  . Marital status: Married    Spouse name: Not on file  . Number of children: Not on file  . Years of education: Not on file  . Highest education level: Not on file  Occupational History  . Occupation: Data processing manager  Tobacco Use  . Smoking status: Never Smoker  . Smokeless tobacco: Never Used  Vaping Use  . Vaping Use: Never used  Substance and Sexual Activity  . Alcohol use: Yes    Alcohol/week: 3.0 standard drinks    Types: 3 Glasses of wine per week    Comment: 1-2 times weekly  . Drug use: No  . Sexual activity: Yes    Partners: Male    Birth control/protection: I.U.D.  Other Topics Concern  . Not on file  Social History Narrative  . Not  on file   Social Determinants of Health   Financial Resource Strain:   . Difficulty of Paying Living Expenses: Not on file  Food Insecurity:   . Worried About Programme researcher, broadcasting/film/video in the Last Year: Not on file  . Ran Out of Food in the Last Year: Not on file  Transportation Needs:   . Lack of Transportation (Medical): Not on file  . Lack of Transportation (Non-Medical): Not on file  Physical Activity:   . Days of Exercise per Week: Not on file  . Minutes of Exercise per Session: Not on file  Stress:   . Feeling of Stress : Not on file  Social Connections:   . Frequency of Communication with Friends and Family: Not on file  . Frequency of Social Gatherings with Friends and Family: Not on file  . Attends Religious Services: Not on file  . Active Member of Clubs or Organizations: Not on file  . Attends Banker Meetings: Not on file  . Marital Status: Not on file   Family History: Family History  Problem Relation Age of Onset  . Cervical polyp Mother   . Diabetes Father   . Prostate cancer Father   . Anesthesia problems Neg Hx    Allergies: No Known Allergies Medications: See med rec.  Review of Systems: See HPI for pertinent positives and negatives.   Objective:    General: Well Developed, well nourished, and in no acute  distress.  Neuro: Alert and oriented x3.  HEENT: Normocephalic, atraumatic.  Skin: Warm and dry. Cardiac: Regular rate and rhythm, no murmurs rubs or gallops, no lower extremity edema.  Respiratory: Clear to auscultation bilaterally. Not using accessory muscles, speaking in full sentences.   Impression and Recommendations:    1. Class 1 obesity due to excess calories without serious comorbidity with body mass index (BMI) of 34.0 to 34.9 in adult Continue Wegovy 0.25mg  for one more week then increase to 0.5mg  weekly. Continue exercise and diet modifications. Discussed methods to increase water intake during the day with a goal of at least  64 ounces.   Return in about 4 weeks (around 01/04/2020) for weight check. ___________________________________________ Thayer Ohm, DNP, APRN, FNP-BC Primary Care and Sports Medicine [facility] [place]

## 2019-12-07 ENCOUNTER — Encounter: Payer: Self-pay | Admitting: Medical-Surgical

## 2019-12-07 ENCOUNTER — Ambulatory Visit (INDEPENDENT_AMBULATORY_CARE_PROVIDER_SITE_OTHER): Payer: Commercial Managed Care - PPO | Admitting: Medical-Surgical

## 2019-12-07 ENCOUNTER — Other Ambulatory Visit: Payer: Self-pay

## 2019-12-07 VITALS — BP 133/85 | HR 71 | Temp 97.9°F | Ht 65.0 in | Wt 210.1 lb

## 2019-12-07 DIAGNOSIS — E6609 Other obesity due to excess calories: Secondary | ICD-10-CM | POA: Diagnosis not present

## 2019-12-07 DIAGNOSIS — Z6834 Body mass index (BMI) 34.0-34.9, adult: Secondary | ICD-10-CM

## 2019-12-13 ENCOUNTER — Other Ambulatory Visit: Payer: Self-pay | Admitting: Medical-Surgical

## 2020-01-04 ENCOUNTER — Other Ambulatory Visit: Payer: Self-pay

## 2020-01-04 ENCOUNTER — Ambulatory Visit (INDEPENDENT_AMBULATORY_CARE_PROVIDER_SITE_OTHER): Payer: Commercial Managed Care - PPO | Admitting: Medical-Surgical

## 2020-01-04 ENCOUNTER — Encounter: Payer: Self-pay | Admitting: Medical-Surgical

## 2020-01-04 VITALS — BP 115/77 | HR 71 | Temp 97.9°F | Ht 65.0 in | Wt 202.2 lb

## 2020-01-04 DIAGNOSIS — Z7689 Persons encountering health services in other specified circumstances: Secondary | ICD-10-CM | POA: Diagnosis not present

## 2020-01-04 NOTE — Progress Notes (Signed)
  Subjective:    CC: weight check  HPI: Pleasant 34 year old female presenting for weight check on Wegovy. Has completed 3 doses of the 0.5mg  injection. Having a little transient nausea the first 2 days after each injection and has notice some mild constipation. Appetite decreased and has to focus on making sure to eat. Notes that she doesn't crave anything now. Still exercising regularly and drinking plenty of water. Feels better overall and clothes are fitting better. Does not weigh regularly at home.   I reviewed the past medical history, family history, social history, surgical history, and allergies today and no changes were needed.  Please see the problem list section below in epic for further details.  Past Medical History: Past Medical History:  Diagnosis Date  . PCOS (polycystic ovarian syndrome)    Past Surgical History: Past Surgical History:  Procedure Laterality Date  . ANTERIOR CRUCIATE LIGAMENT REPAIR  2002  . CESAREAN SECTION  05/21/2011   Procedure: CESAREAN SECTION;  Surgeon: Tilda Burrow, MD;  Location: [facility];  Service: Gynecology;  Laterality: N/A;  Primary Cesarean Section Delivery Baby Girl @ 0400, Apgars 9/9   Social History: Social History   Socioeconomic History  . Marital status: Married    Spouse name: Not on file  . Number of children: Not on file  . Years of education: Not on file  . Highest education level: Not on file  Occupational History  . Occupation: Data processing manager  Tobacco Use  . Smoking status: Never Smoker  . Smokeless tobacco: Never Used  Vaping Use  . Vaping Use: Never used  Substance and Sexual Activity  . Alcohol use: Yes    Alcohol/week: 3.0 standard drinks    Types: 3 Glasses of wine per week    Comment: 1-2 times weekly  . Drug use: No  . Sexual activity: Yes    Partners: Male    Birth control/protection: I.U.D.  Other Topics Concern  . Not on file  Social History Narrative  . Not on file   Social Determinants of  Health   Financial Resource Strain: Not on file  Food Insecurity: Not on file  Transportation Needs: Not on file  Physical Activity: Not on file  Stress: Not on file  Social Connections: Not on file   Family History: Family History  Problem Relation Age of Onset  . Cervical polyp Mother   . Diabetes Father   . Prostate cancer Father   . Anesthesia problems Neg Hx    Allergies: No Known Allergies Medications: See med rec.  Review of Systems: See HPI for pertinent positives and negatives.   Objective:    General: Well Developed, well nourished, and in no acute distress.  Neuro: Alert and oriented x3.  HEENT: Normocephalic, atraumatic.  Skin: Warm and dry. Cardiac: Regular rate and rhythm, no murmurs rubs or gallops, no lower extremity edema.  Respiratory: Clear to auscultation bilaterally. Not using accessory muscles, speaking in full sentences.   Impression and Recommendations:    1. Encounter for weight management 8 lb weight loss in 4 weeks. Continue Wegovy 0.5mg  for one more dose then increase to 1mg  weekly. Recommend adding a stool softener or fiber supplement daily to help with constipation. If nausea becomes bothersome, advised patient to notify me and I will send in zofran as needed.   Return in about 4 weeks (around 02/01/2020) for weight check . ___________________________________________ 02/03/2020, DNP, APRN, FNP-BC Primary Care and Sports Medicine [facility] [place]

## 2020-01-25 NOTE — Telephone Encounter (Signed)
Error

## 2020-02-01 ENCOUNTER — Ambulatory Visit (INDEPENDENT_AMBULATORY_CARE_PROVIDER_SITE_OTHER): Payer: Commercial Managed Care - PPO | Admitting: Medical-Surgical

## 2020-02-01 ENCOUNTER — Encounter: Payer: Self-pay | Admitting: Medical-Surgical

## 2020-02-01 ENCOUNTER — Other Ambulatory Visit: Payer: Self-pay

## 2020-02-01 VITALS — BP 112/69 | HR 69 | Temp 97.8°F | Ht 65.0 in | Wt 197.5 lb

## 2020-02-01 DIAGNOSIS — Z7689 Persons encountering health services in other specified circumstances: Secondary | ICD-10-CM

## 2020-02-01 DIAGNOSIS — Z98891 History of uterine scar from previous surgery: Secondary | ICD-10-CM

## 2020-02-01 DIAGNOSIS — Z6834 Body mass index (BMI) 34.0-34.9, adult: Secondary | ICD-10-CM | POA: Diagnosis not present

## 2020-02-01 DIAGNOSIS — E6609 Other obesity due to excess calories: Secondary | ICD-10-CM | POA: Diagnosis not present

## 2020-02-01 HISTORY — DX: History of uterine scar from previous surgery: Z98.891

## 2020-02-01 NOTE — Progress Notes (Signed)
  Subjective:    CC: Weight check  HPI: Pleasant 35 year old female presenting today for weight check on Wegovy.  She has completed 3 weeks of 1 mg injections, tolerating well overall.  She does still have some transient nausea the morning after her weekly injection but this is mild and resolves spontaneously.  Continues to eat small portions and experience appetite suppression.  Still working out regularly.  Reports she is very excited to be under 200 pounds and this is giving her even more motivation.  Denies fever, chills, shortness of breath, chest pain, and constipation.  I reviewed the past medical history, family history, social history, surgical history, and allergies today and no changes were needed.  Please see the problem list section below in epic for further details.  Past Medical History: Past Medical History:  Diagnosis Date  . PCOS (polycystic ovarian syndrome)    Past Surgical History: Past Surgical History:  Procedure Laterality Date  . ANTERIOR CRUCIATE LIGAMENT REPAIR  2002  . CESAREAN SECTION  05/21/2011   Procedure: CESAREAN SECTION;  Surgeon: Tilda Burrow, MD;  Location: [facility];  Service: Gynecology;  Laterality: N/A;  Primary Cesarean Section Delivery Baby Girl @ 0400, Apgars 9/9   Social History: Social History   Socioeconomic History  . Marital status: Married    Spouse name: Not on file  . Number of children: Not on file  . Years of education: Not on file  . Highest education level: Not on file  Occupational History  . Occupation: Data processing manager  Tobacco Use  . Smoking status: Never Smoker  . Smokeless tobacco: Never Used  Vaping Use  . Vaping Use: Never used  Substance and Sexual Activity  . Alcohol use: Yes    Alcohol/week: 3.0 standard drinks    Types: 3 Glasses of wine per week    Comment: 1-2 times weekly  . Drug use: No  . Sexual activity: Yes    Partners: Male    Birth control/protection: I.U.D.  Other Topics Concern  . Not on file   Social History Narrative  . Not on file   Social Determinants of Health   Financial Resource Strain: Not on file  Food Insecurity: Not on file  Transportation Needs: Not on file  Physical Activity: Not on file  Stress: Not on file  Social Connections: Not on file   Family History: Family History  Problem Relation Age of Onset  . Cervical polyp Mother   . Diabetes Father   . Prostate cancer Father   . Anesthesia problems Neg Hx    Allergies: No Known Allergies Medications: See med rec.  Review of Systems: See HPI for pertinent positives and negatives.   Objective:    General: Well Developed, well nourished, and in no acute distress.  Neuro: Alert and oriented x3.  HEENT: Normocephalic, atraumatic.  Skin: Warm and dry. Cardiac: Regular rate and rhythm, no murmurs rubs or gallops, no lower extremity edema.  Respiratory: Clear to auscultation bilaterally. Not using accessory muscles, speaking in full sentences.  Impression and Recommendations:    1. Encounter for weight management Nearly 5 pound weight loss in the last 4 weeks.  Continue Wegovy 1 mg for one more week then increase to 1.7 mg weekly.  Continue lifestyle and diet modifications.  Return in about 4 weeks (around 02/29/2020) for weight check. ___________________________________________ Thayer Ohm, DNP, APRN, FNP-BC Primary Care and Sports Medicine [facility] [place]

## 2020-02-01 NOTE — Patient Instructions (Addendum)
Weight loss tips and tricks:  1.  Make sure to drink at least 64 ounces of water every day. 2.  Avoid alcohol. 3.  Avoid eating within 3 hours of going to bed. 4.  Cut out sugary drinks such as sweet tea, regular sodas, energy drinks, etc. 5.  Make sure you are getting enough sleep every night. 6.  Start making changes by cutting back portion sizes by 1/3. 7.  Keep a food diary to help identify areas for improvement and promote awareness of bad habits. 8.  Increase your activity.  Choose something you like to do that is fun for you so you are more likely to stick with it. 9.  Do not forget your protein! 10.  Measure your neck, upper arms, waist, hips, and thighs and write those measurements down somewhere before you start your weight loss journey.  Changes in your measurements will tell a far more accurate story than the number on a scale. 11.  As you go, pay attention to how your clothes fit! 12.  Weigh yourself as often or as little as you need to.  Some folks do better weighing every day while others do better with once a week. 13.  Do not get discouraged!  Weight loss efforts are meant to be lifestyle changes.  Once you stop a medication (if you are taking 1), your behaviors and habits will determine if you maintain your weight loss or not.  For those on medications:  1.  Take your medication as prescribed. 2.  Common side effects include nausea and constipation.  To combat this, increased your daily water consumption.  Consider adding in a stool softener (available OTC) if needed.  Also increase fiber intake with vegetables and fruits. 3.  If you have any side effects or concerns while taking medication, please do not hesitate to reach out to [place] here at the office. 4.  While on weight loss medications, we do require you to follow-up every 4 weeks with an in office visit.  Refills will not be called in early on controlled substances.  Good luck on your weight loss journey!  Have faith in  your self and you will reach your goals!

## 2020-02-15 ENCOUNTER — Telehealth: Payer: Self-pay

## 2020-02-15 NOTE — Telephone Encounter (Signed)
Med PA filed through CoverMyMeds for: Wegovy 

## 2020-02-23 NOTE — Telephone Encounter (Signed)
PA for [facility] denied by [employer].

## 2020-03-01 ENCOUNTER — Encounter: Payer: Self-pay | Admitting: Medical-Surgical

## 2020-03-01 ENCOUNTER — Ambulatory Visit (INDEPENDENT_AMBULATORY_CARE_PROVIDER_SITE_OTHER): Payer: Commercial Managed Care - PPO | Admitting: Medical-Surgical

## 2020-03-01 ENCOUNTER — Other Ambulatory Visit: Payer: Self-pay

## 2020-03-01 VITALS — BP 112/78 | HR 78 | Wt 190.0 lb

## 2020-03-01 DIAGNOSIS — Z7689 Persons encountering health services in other specified circumstances: Secondary | ICD-10-CM

## 2020-03-01 DIAGNOSIS — Z6831 Body mass index (BMI) 31.0-31.9, adult: Secondary | ICD-10-CM

## 2020-03-01 DIAGNOSIS — E6609 Other obesity due to excess calories: Secondary | ICD-10-CM | POA: Diagnosis not present

## 2020-03-01 NOTE — Progress Notes (Signed)
Subjective:    CC: Weight check  HPI: Pleasant 35 year old female presenting today for weight check on Wegovy.  She has completed 3 weeks of the 1.7 mg dose.  Notes that it is definitely stronger than the previous dose and she continues to have nausea for the first 2 to 3 days after her injection.  After that the nausea fades.  She has found increasing her p.o. fluid intake helps resolve the nausea.  She is a significantly decreased appetite and is eating very small meals.  At times she feels she has to force herself to eat.  She continues to exercise 3-4 times weekly including cardio and strength training.  At home she has obtained a standing desk and uses a treadmill when on long meetings.  She aims for 8-10,000 steps per day.  I reviewed the past medical history, family history, social history, surgical history, and allergies today and no changes were needed.  Please see the problem list section below in epic for further details.  Past Medical History: Past Medical History:  Diagnosis Date  . PCOS (polycystic ovarian syndrome)    Past Surgical History: Past Surgical History:  Procedure Laterality Date  . ANTERIOR CRUCIATE LIGAMENT REPAIR  2002  . CESAREAN SECTION  05/21/2011   Procedure: CESAREAN SECTION;  Surgeon: Tilda Burrow, MD;  Location: [facility];  Service: Gynecology;  Laterality: N/A;  Primary Cesarean Section Delivery Baby Girl @ 0400, Apgars 9/9   Social History: Social History   Socioeconomic History  . Marital status: Married    Spouse name: Not on file  . Number of children: Not on file  . Years of education: Not on file  . Highest education level: Not on file  Occupational History  . Occupation: Data processing manager  Tobacco Use  . Smoking status: Never Smoker  . Smokeless tobacco: Never Used  Vaping Use  . Vaping Use: Never used  Substance and Sexual Activity  . Alcohol use: Yes    Alcohol/week: 3.0 standard drinks    Types: 3 Glasses of wine per week     Comment: 1-2 times weekly  . Drug use: No  . Sexual activity: Yes    Partners: Male    Birth control/protection: I.U.D.  Other Topics Concern  . Not on file  Social History Narrative  . Not on file   Social Determinants of Health   Financial Resource Strain: Not on file  Food Insecurity: Not on file  Transportation Needs: Not on file  Physical Activity: Not on file  Stress: Not on file  Social Connections: Not on file   Family History: Family History  Problem Relation Age of Onset  . Cervical polyp Mother   . Diabetes Father   . Prostate cancer Father   . Anesthesia problems Neg Hx    Allergies: No Known Allergies Medications: See med rec.  Review of Systems: See HPI for pertinent positives and negatives.   Objective:    General: Well Developed, well nourished, and in no acute distress.  Neuro: Alert and oriented x3.  HEENT: Normocephalic, atraumatic.  Skin: Warm and dry. Cardiac: Regular rate and rhythm, no murmurs rubs or gallops, no lower extremity edema.  Respiratory: Clear to auscultation bilaterally. Not using accessory muscles, speaking in full sentences.  Impression and Recommendations:    1. Encounter for weight management 2. Class 1 obesity due to excess calories without serious comorbidity with body mass index (BMI) of 31.0 to 31.9 in adult Continue Wegovy 1.7 mg x 1  more dose then increase to 2.4mg  weekly.  Continue lifestyle modifications.  We will need to consider what medicine will be appropriate once she finishes her 6 months of Wegovy.  A prior authorization was submitted to her pharmacy/insurance but it was denied for [facility].  Return in about 4 weeks (around 03/29/2020) for weight check. ___________________________________________ Thayer Ohm, DNP, APRN, FNP-BC Primary Care and Sports Medicine [facility]

## 2020-03-28 NOTE — Progress Notes (Signed)
Subjective:    CC: weight check  HPI: Pleasant 35 year old female presenting today for weight check on Wegovy. Has completed 3 weeks of 2.4mg  injections weekly and is tolerating well. Does still have some transient AM nausea for the first couple of days after each injection but this is very manageable. Continues to exercise several times weekly and is eating small portions/low calorie diet. Eating and drinking well. Sleeping well. Denies nausea, vomiting, constipation, headaches, dizziness, chest pain, and shortness of breath.   I reviewed the past medical history, family history, social history, surgical history, and allergies today and no changes were needed.  Please see the problem list section below in epic for further details.  Past Medical History: Past Medical History:  Diagnosis Date  . PCOS (polycystic ovarian syndrome)    Past Surgical History: Past Surgical History:  Procedure Laterality Date  . ANTERIOR CRUCIATE LIGAMENT REPAIR  2002  . CESAREAN SECTION  05/21/2011   Procedure: CESAREAN SECTION;  Surgeon: Tilda Burrow, MD;  Location: [facility];  Service: Gynecology;  Laterality: N/A;  Primary Cesarean Section Delivery Baby Girl @ 0400, Apgars 9/9   Social History: Social History   Socioeconomic History  . Marital status: Married    Spouse name: Not on file  . Number of children: Not on file  . Years of education: Not on file  . Highest education level: Not on file  Occupational History  . Occupation: Data processing manager  Tobacco Use  . Smoking status: Never Smoker  . Smokeless tobacco: Never Used  Vaping Use  . Vaping Use: Never used  Substance and Sexual Activity  . Alcohol use: Yes    Alcohol/week: 3.0 standard drinks    Types: 3 Glasses of wine per week    Comment: 1-2 times weekly  . Drug use: No  . Sexual activity: Yes    Partners: Male    Birth control/protection: I.U.D.  Other Topics Concern  . Not on file  Social History Narrative  . Not on file    Social Determinants of Health   Financial Resource Strain: Not on file  Food Insecurity: Not on file  Transportation Needs: Not on file  Physical Activity: Not on file  Stress: Not on file  Social Connections: Not on file   Family History: Family History  Problem Relation Age of Onset  . Cervical polyp Mother   . Diabetes Father   . Prostate cancer Father   . Anesthesia problems Neg Hx    Allergies: No Known Allergies Medications: See med rec.  Review of Systems: See HPI for pertinent positives and negatives.   Objective:    General: Well Developed, well nourished, and in no acute distress.  Neuro: Alert and oriented x3.  HEENT: Normocephalic, atraumatic.  Skin: Warm and dry. Cardiac: Regular rate and rhythm, no murmurs rubs or gallops, no lower extremity edema.  Respiratory: Clear to auscultation bilaterally. Not using accessory muscles, speaking in full sentences.  Impression and Recommendations:    1. Encounter for weight management/class 1 obesity 10lb weight loss in the last 4 weeks. Continue Wegovy 2.4 mg weekly for the next 5 weeks. After that, the discount card will be exhausted. A prior authorization has been submitted and denied for continuation of Wegovy. Discussed alternative medications for weight loss that can be started to continue weight loss efforts. Information provided to patient with AVS and she will let me know what she would like to try. If she wants to see about getting Saxenda covered, she  will let me know in a couple of weeks so we can submit it to insurance for approval. Continue lifestyle modifications and dietary limitations.   Return in about 4 weeks (around 04/26/2020) for weight check. ___________________________________________ Thayer Ohm, DNP, APRN, FNP-BC Primary Care and Sports Medicine [facility] [place]

## 2020-03-29 ENCOUNTER — Other Ambulatory Visit: Payer: Self-pay

## 2020-03-29 ENCOUNTER — Encounter: Payer: Self-pay | Admitting: Medical-Surgical

## 2020-03-29 ENCOUNTER — Ambulatory Visit (INDEPENDENT_AMBULATORY_CARE_PROVIDER_SITE_OTHER): Payer: Commercial Managed Care - PPO | Admitting: Medical-Surgical

## 2020-03-29 VITALS — BP 122/74 | HR 75 | Temp 98.1°F | Ht 65.0 in | Wt 180.8 lb

## 2020-03-29 DIAGNOSIS — Z7689 Persons encountering health services in other specified circumstances: Secondary | ICD-10-CM

## 2020-03-29 DIAGNOSIS — Z6831 Body mass index (BMI) 31.0-31.9, adult: Secondary | ICD-10-CM | POA: Diagnosis not present

## 2020-03-29 DIAGNOSIS — E6609 Other obesity due to excess calories: Secondary | ICD-10-CM

## 2020-03-29 NOTE — Patient Instructions (Signed)
Metformin- off label use for weight loss, helps with blood glucose and appetite suppression  Topamax- used to help with appetite suppression, often used with phentermine as a combination (Qsymia)  Saxenda- injection similar to [facility] but it's a daily injection instead of weekly  Wellbutrin- often used for help with weight loss but likely less appetite suppression

## 2020-04-08 ENCOUNTER — Encounter: Payer: Self-pay | Admitting: Medical-Surgical

## 2020-04-19 ENCOUNTER — Other Ambulatory Visit: Payer: Self-pay | Admitting: Medical-Surgical

## 2020-04-19 MED ORDER — SAXENDA 18 MG/3ML ~~LOC~~ SOPN: PEN_INJECTOR | SUBCUTANEOUS | 0 refills | Status: DC

## 2020-04-19 NOTE — Progress Notes (Signed)
Discount coupon for Erin Stone has been exhausted.  Patient has considered options for continued weight loss management and would like to try Saxenda.  Unclear if her insurance will approve this but she has been able to locate a discount card that will help with the cost.  Sending Saxenda to her pharmacy for taper instructions starting at 0.6 mg and increasing weekly to a total dose of 3 mg daily.  Thayer Ohm, DNP, APRN, FNP-BC [facility] and Sports Medicine

## 2020-04-26 ENCOUNTER — Other Ambulatory Visit: Payer: Self-pay | Admitting: Medical-Surgical

## 2020-04-26 ENCOUNTER — Ambulatory Visit: Payer: Commercial Managed Care - PPO | Admitting: Medical-Surgical

## 2020-05-11 ENCOUNTER — Encounter: Payer: Self-pay | Admitting: Physician Assistant

## 2020-05-11 ENCOUNTER — Telehealth: Payer: Commercial Managed Care - PPO | Admitting: Physician Assistant

## 2020-05-11 DIAGNOSIS — J0140 Acute pansinusitis, unspecified: Secondary | ICD-10-CM

## 2020-05-11 MED ORDER — AMOXICILLIN-POT CLAVULANATE 875-125 MG PO TABS: 1.0000 | ORAL_TABLET | Freq: Two times a day (BID) | ORAL | 0 refills | Status: DC

## 2020-05-11 MED ORDER — BENZONATATE 100 MG PO CAPS: 100.0000 mg | ORAL_CAPSULE | Freq: Three times a day (TID) | ORAL | 0 refills | Status: DC | PRN

## 2020-05-11 NOTE — Patient Instructions (Signed)
Sinusitis, Adult Sinusitis is inflammation of your sinuses. Sinuses are hollow spaces in the bones around your face. Your sinuses are located:  Around your eyes.  In the middle of your forehead.  Behind your nose.  In your cheekbones. Mucus normally drains out of your sinuses. When your nasal tissues become inflamed or swollen, mucus can become trapped or blocked. This allows bacteria, viruses, and fungi to grow, which leads to infection. Most infections of the sinuses are caused by a virus. Sinusitis can develop quickly. It can last for up to 4 weeks (acute) or for more than 12 weeks (chronic). Sinusitis often develops after a cold. What are the causes? This condition is caused by anything that creates swelling in the sinuses or stops mucus from draining. This includes:  Allergies.  Asthma.  Infection from bacteria or viruses.  Deformities or blockages in your nose or sinuses.  Abnormal growths in the nose (nasal polyps).  Pollutants, such as chemicals or irritants in the air.  Infection from fungi (rare). What increases the risk? You are more likely to develop this condition if you:  Have a weak body defense system (immune system).  Do a lot of swimming or diving.  Overuse nasal sprays.  Smoke. What are the signs or symptoms? The main symptoms of this condition are pain and a feeling of pressure around the affected sinuses. Other symptoms include:  Stuffy nose or congestion.  Thick drainage from your nose.  Swelling and warmth over the affected sinuses.  Headache.  Upper toothache.  A cough that may get worse at night.  Extra mucus that collects in the throat or the back of the nose (postnasal drip).  Decreased sense of smell and taste.  Fatigue.  A fever.  Sore throat.  Bad breath. How is this diagnosed? This condition is diagnosed based on:  Your symptoms.  Your medical history.  A physical exam.  Tests to find out if your condition is  acute or chronic. This may include: ? Checking your nose for nasal polyps. ? Viewing your sinuses using a device that has a light (endoscope). ? Testing for allergies or bacteria. ? Imaging tests, such as an MRI or CT scan. In rare cases, a bone biopsy may be done to rule out more serious types of fungal sinus disease. How is this treated? Treatment for sinusitis depends on the cause and whether your condition is chronic or acute.  If caused by a virus, your symptoms should go away on their own within 10 days. You may be given medicines to relieve symptoms. They include: ? Medicines that shrink swollen nasal passages (topical intranasal decongestants). ? Medicines that treat allergies (antihistamines). ? A spray that eases inflammation of the nostrils (topical intranasal corticosteroids). ? Rinses that help get rid of thick mucus in your nose (nasal saline washes).  If caused by bacteria, your health care provider may recommend waiting to see if your symptoms improve. Most bacterial infections will get better without antibiotic medicine. You may be given antibiotics if you have: ? A severe infection. ? A weak immune system.  If caused by narrow nasal passages or nasal polyps, you may need to have surgery. Follow these instructions at home: Medicines  Take, use, or apply over-the-counter and prescription medicines only as told by your health care provider. These may include nasal sprays.  If you were prescribed an antibiotic medicine, take it as told by your health care provider. Do not stop taking the antibiotic even if you start   to feel better. Hydrate and humidify  Drink enough fluid to keep your urine pale yellow. Staying hydrated will help to thin your mucus.  Use a cool mist humidifier to keep the humidity level in your home above 50%.  Inhale steam for 10-15 minutes, 3-4 times a day, or as told by your health care provider. You can do this in the bathroom while a hot shower is  running.  Limit your exposure to cool or dry air.   Rest  Rest as much as possible.  Sleep with your head raised (elevated).  Make sure you get enough sleep each night. General instructions  Apply a warm, moist washcloth to your face 3-4 times a day or as told by your health care provider. This will help with discomfort.  Wash your hands often with soap and water to reduce your exposure to germs. If soap and water are not available, use hand sanitizer.  Do not smoke. Avoid being around people who are smoking (secondhand smoke).  Keep all follow-up visits as told by your health care provider. This is important.   Contact a health care provider if:  You have a fever.  Your symptoms get worse.  Your symptoms do not improve within 10 days. Get help right away if:  You have a severe headache.  You have persistent vomiting.  You have severe pain or swelling around your face or eyes.  You have vision problems.  You develop confusion.  Your neck is stiff.  You have trouble breathing. Summary  Sinusitis is soreness and inflammation of your sinuses. Sinuses are hollow spaces in the bones around your face.  This condition is caused by nasal tissues that become inflamed or swollen. The swelling traps or blocks the flow of mucus. This allows bacteria, viruses, and fungi to grow, which leads to infection.  If you were prescribed an antibiotic medicine, take it as told by your health care provider. Do not stop taking the antibiotic even if you start to feel better.  Keep all follow-up visits as told by your health care provider. This is important. This information is not intended to replace advice given to you by your health care provider. Make sure you discuss any questions you have with your health care provider. Document Revised: 06/07/2017 Document Reviewed: 06/07/2017 Elsevier Patient Education  2021 Elsevier Inc.  

## 2020-05-11 NOTE — Progress Notes (Signed)
[name], Erin Stone are scheduled for a virtual visit with your provider today.    Just as we do with appointments in the office, we must obtain your consent to participate.  Your consent will be active for this visit and any virtual visit you may have with one of our providers in the next 365 days.    If you have a MyChart account, I can also send a copy of this consent to you electronically.  All virtual visits are billed to your insurance company just like a traditional visit in the office.  As this is a virtual visit, video technology does not allow for your provider to perform a traditional examination.  This may limit your provider's ability to fully assess your condition.  If your provider identifies any concerns that need to be evaluated in person or the need to arrange testing such as labs, EKG, etc, we will make arrangements to do so.    Although advances in technology are sophisticated, we cannot ensure that it will always work on either your end or our end.  If the connection with a video visit is poor, we may have to switch to a telephone visit.  With either a video or telephone visit, we are not always able to ensure that we have a secure connection.   I need to obtain your verbal consent now.   Are you willing to proceed with your visit today?   Erin Stone has provided verbal consent on 05/11/2020 for a virtual visit (video or telephone).   Erin Loveless, PA-C 05/11/2020  8:27 AM     MyChart Video Visit    Virtual Visit via Video Note   This visit type was conducted due to national recommendations for restrictions regarding the COVID-19 Pandemic (e.g. social distancing) in an effort to limit this patient's exposure and mitigate transmission in our community. This patient is at least at moderate risk for complications without adequate follow up. This format is felt to be most appropriate for this patient at this time. Physical exam was limited by quality of the video and audio  technology used for the visit.   Patient location: Home  Provider location: Home office in [place]  I discussed the limitations of evaluation and management by telemedicine and the availability of in person appointments. The patient expressed understanding and agreed to proceed.  [name]   DOB: [birth year]   35 y.o. Female  MRN: [id number] Visit Date: 05/11/2020  Today's healthcare provider: Margaretann Loveless, PA-C   No chief complaint on file.  Subjective    Sinusitis This is a new problem. The current episode started in the past 7 days. The problem has been gradually worsening since onset. There has been no fever. Associated symptoms include chills, congestion, coughing, headaches and sinus pressure. Pertinent negatives include no ear pain, neck pain, shortness of breath, sneezing, sore throat or swollen glands.     Patient Active Problem List   Diagnosis Date Noted  . History of cesarean section 02/01/2020  . Suspected fetal anomaly, antepartum 02/22/2011  . ACNE VULGARIS 09/08/2007  . WEIGHT GAIN, ABNORMAL 09/08/2007   Past Medical History:  Diagnosis Date  . PCOS (polycystic ovarian syndrome)       Medications: Outpatient Medications Prior to Visit  Medication Sig  . Cyanocobalamin (B-12 PO) Take 1 tablet by mouth daily.  Marland Kitchen levonorgestrel (MIRENA) 20 MCG/24HR IUD 1 each by Intrauterine route once.  . Liraglutide -Weight Management (SAXENDA) 18 MG/3ML  SOPN Inject 0.6 mg into the skin daily for 7 days, THEN 1.2 mg daily for 7 days, THEN 1.8 mg daily for 7 days, THEN 2.4 mg daily for 7 days, THEN 3 mg daily. 0.6 mg inj subcut daily for 1 week, then incr by 0.6 mg weekly until reaching 3 mg injected subcut daily.  Marland Kitchen MELATONIN PO Take 1 capsule by mouth at bedtime as needed.  . Multiple Vitamin (MULTIVITAMIN) tablet Take 1 tablet by mouth daily.  . Nutritional Supplements (VITAMIN D BOOSTER PO) Take 1 tablet by mouth daily.  . Omega-3 Fatty Acids (FISH OIL  BURP-LESS PO) Take 1 capsule by mouth daily.   No facility-administered medications prior to visit.    Review of Systems  Constitutional: Positive for chills. Negative for fever.  HENT: Positive for congestion, postnasal drip, rhinorrhea, sinus pressure and sinus pain. Negative for ear pain, sneezing and sore throat.   Respiratory: Positive for cough. Negative for chest tightness, shortness of breath and wheezing.   Cardiovascular: Negative.   Musculoskeletal: Negative for neck pain.  Neurological: Positive for headaches. Negative for dizziness.    Last CBC Lab Results  Component Value Date   WBC 5.3 09/13/2019   HGB 15.6 (H) 09/13/2019   HCT 46.4 (H) 09/13/2019   MCV 92.4 09/13/2019   MCH 31.1 09/13/2019   RDW 12.0 09/13/2019   PLT 262 09/13/2019   Last metabolic panel Lab Results  Component Value Date   GLUCOSE 87 09/13/2019   NA 138 09/13/2019   K 4.1 09/13/2019   CL 106 09/13/2019   CO2 25 09/13/2019   BUN 17 09/13/2019   CREATININE 1.09 09/13/2019   GFRNONAA 67 09/13/2019   GFRAA 77 09/13/2019   CALCIUM 9.5 09/13/2019   PROT 7.1 09/13/2019   ALBUMIN 2.8 (L) 05/18/2011   BILITOT 0.6 09/13/2019   ALKPHOS 159 (H) 05/18/2011   AST 16 09/13/2019   ALT 18 09/13/2019      Objective    There were no vitals taken for this visit. BP Readings from Last 3 Encounters:  03/29/20 122/74  03/01/20 112/78  02/01/20 112/69   Wt Readings from Last 3 Encounters:  03/29/20 180 lb 12.8 oz (82 kg)  03/01/20 190 lb 0.2 oz (86.2 kg)  02/01/20 197 lb 8 oz (89.6 kg)      Physical Exam Vitals reviewed.  Constitutional:      General: She is not in acute distress.    Appearance: Normal appearance. She is well-developed. She is not ill-appearing.  HENT:     Head: Normocephalic and atraumatic.  Pulmonary:     Effort: Pulmonary effort is normal. No respiratory distress (no difficulty speaking in full sentences, dry cough heard once).  Musculoskeletal:     Cervical back:  Normal range of motion and neck supple.  Neurological:     Mental Status: She is alert.  Psychiatric:        Behavior: Behavior normal.        Thought Content: Thought content normal.        Judgment: Judgment normal.       Assessment & Plan     1. Acute non-recurrent pansinusitis Worsening symptoms that have not responded to OTC medications. Will give augmentin as below. Tessalon perles for cough. Continue allergy medications. Stay well hydrated and get plenty of rest. Call if no symptom improvement or if symptoms worsen. - amoxicillin-clavulanate (AUGMENTIN) 875-125 MG tablet; Take 1 tablet by mouth 2 (two) times daily.  Dispense: 20 tablet; Refill:  0 - benzonatate (TESSALON) 100 MG capsule; Take 1 capsule (100 mg total) by mouth 3 (three) times daily as needed for cough.  Dispense: 30 capsule; Refill: 0   No follow-ups on file.     I discussed the assessment and treatment plan with the patient. The patient was provided an opportunity to ask questions and all were answered. The patient agreed with the plan and demonstrated an understanding of the instructions.   The patient was advised to call back or seek an in-person evaluation if the symptoms worsen or if the condition fails to improve as anticipated.  I provided 13 minutes of face-to-face time during this encounter via MyChart Video enabled encounter.   Reine Just [facility] [phone] (phone) [phone] (fax)  [facility]

## 2020-05-16 ENCOUNTER — Other Ambulatory Visit: Payer: Self-pay | Admitting: Medical-Surgical

## 2020-05-16 MED ORDER — METFORMIN HCL ER 500 MG PO TB24: 500.0000 mg | ORAL_TABLET | Freq: Every day | ORAL | 11 refills | Status: DC

## 2020-06-18 ENCOUNTER — Other Ambulatory Visit: Payer: Self-pay

## 2020-06-18 ENCOUNTER — Encounter: Payer: Self-pay | Admitting: Medical-Surgical

## 2020-06-18 ENCOUNTER — Ambulatory Visit (INDEPENDENT_AMBULATORY_CARE_PROVIDER_SITE_OTHER): Payer: Commercial Managed Care - PPO | Admitting: Medical-Surgical

## 2020-06-18 VITALS — BP 130/87 | HR 65 | Temp 98.3°F | Resp 20 | Ht 65.0 in | Wt 192.0 lb

## 2020-06-18 DIAGNOSIS — Z6831 Body mass index (BMI) 31.0-31.9, adult: Secondary | ICD-10-CM | POA: Diagnosis not present

## 2020-06-18 DIAGNOSIS — E6609 Other obesity due to excess calories: Secondary | ICD-10-CM

## 2020-06-18 MED ORDER — METFORMIN HCL ER 500 MG PO TB24: 500.0000 mg | ORAL_TABLET | Freq: Every day | ORAL | 3 refills | Status: DC

## 2020-06-18 NOTE — Progress Notes (Addendum)
Subjective:    CC: Weight check  HPI: Pleasant 35 year old female presenting for weight check.  Due to issues with insurance coverage, we were unable to start her on Saxenda or Wegovy.  Since she did not have much benefit to phentermine, it was decided to start metformin.  She has been taking metformin XR 500 mg daily, tolerating well.  She does note that there have been some GI symptoms but nothing that is intolerable.  Over the past 4 weeks, she has worked to increase her protein and make healthier choices.  She does have a hard time with breakfast since she does not feel hungry in the morning.  Feels that that may be part of her problems since she does get hungrier in the evenings.  She is still exercising 3-4 times weekly doing weights and cardio.  She weighs her self once a week at home.  Notes that she weighed on Friday and her weight was 188 pounds.  Admits that over the [facility] Day weekend, she was not as studious with her diet and exercise regimen.  Denies fever, chills, shortness of breath, chest pain, and significant GI symptoms.  I reviewed the past medical history, family history, social history, surgical history, and allergies today and no changes were needed.  Please see the problem list section below in epic for further details.  Past Medical History: Past Medical History:  Diagnosis Date  . PCOS (polycystic ovarian syndrome)    Past Surgical History: Past Surgical History:  Procedure Laterality Date  . ANTERIOR CRUCIATE LIGAMENT REPAIR  2002  . CESAREAN SECTION  05/21/2011   Procedure: CESAREAN SECTION;  Surgeon: Tilda Burrow, MD;  Location: [facility];  Service: Gynecology;  Laterality: N/A;  Primary Cesarean Section Delivery Baby Girl @ 0400, Apgars 9/9   Social History: Social History   Socioeconomic History  . Marital status: Married    Spouse name: Not on file  . Number of children: Not on file  . Years of education: Not on file  . Highest education level: Not on  file  Occupational History  . Occupation: Data processing manager  Tobacco Use  . Smoking status: Never Smoker  . Smokeless tobacco: Never Used  Vaping Use  . Vaping Use: Never used  Substance and Sexual Activity  . Alcohol use: Yes    Alcohol/week: 3.0 standard drinks    Types: 3 Glasses of wine per week    Comment: 1-2 times weekly  . Drug use: No  . Sexual activity: Yes    Partners: Male    Birth control/protection: I.U.D.  Other Topics Concern  . Not on file  Social History Narrative  . Not on file   Social Determinants of Health   Financial Resource Strain: Not on file  Food Insecurity: Not on file  Transportation Needs: Not on file  Physical Activity: Not on file  Stress: Not on file  Social Connections: Not on file   Family History: Family History  Problem Relation Age of Onset  . Cervical polyp Mother   . Diabetes Father   . Prostate cancer Father   . Anesthesia problems Neg Hx    Allergies: No Known Allergies Medications: See med rec.  Review of Systems: See HPI for pertinent positives and negatives.   Objective:    General: Well Developed, well nourished, and in no acute distress.  Neuro: Alert and oriented x3.  HEENT: Normocephalic, atraumatic.  Skin: Warm and dry. Cardiac: Regular rate and rhythm.  Respiratory:  Not using  accessory muscles, speaking in full sentences.  Impression and Recommendations:    1. Class 1 obesity due to excess calories without serious comorbidity with body mass index (BMI) of 31.0 to 31.9 in adult Continue metformin XR 500 mg daily.  Reviewed recommended dietary and lifestyle modifications.  Discussed possible additions to metformin to help with appetite suppression but she would like to hold off on this for now.  Return in about 3 months (around 09/18/2020) for weight check. ___________________________________________ Thayer Ohm, DNP, APRN, FNP-BC [facility] [place]

## 2020-09-09 LAB — HM PAP SMEAR: HM Pap smear: NORMAL

## 2020-09-18 ENCOUNTER — Ambulatory Visit: Payer: Commercial Managed Care - PPO | Admitting: Medical-Surgical

## 2020-09-30 ENCOUNTER — Other Ambulatory Visit: Payer: Self-pay

## 2020-09-30 ENCOUNTER — Ambulatory Visit (INDEPENDENT_AMBULATORY_CARE_PROVIDER_SITE_OTHER): Payer: Commercial Managed Care - PPO | Admitting: Medical-Surgical

## 2020-09-30 ENCOUNTER — Encounter: Payer: Self-pay | Admitting: Medical-Surgical

## 2020-09-30 VITALS — BP 129/84 | HR 65 | Resp 20 | Ht 64.0 in | Wt 194.0 lb

## 2020-09-30 DIAGNOSIS — Z23 Encounter for immunization: Secondary | ICD-10-CM

## 2020-09-30 DIAGNOSIS — Z131 Encounter for screening for diabetes mellitus: Secondary | ICD-10-CM | POA: Diagnosis not present

## 2020-09-30 DIAGNOSIS — Z0000 Encounter for general adult medical examination without abnormal findings: Secondary | ICD-10-CM

## 2020-09-30 DIAGNOSIS — Z1329 Encounter for screening for other suspected endocrine disorder: Secondary | ICD-10-CM

## 2020-09-30 DIAGNOSIS — Z1159 Encounter for screening for other viral diseases: Secondary | ICD-10-CM

## 2020-09-30 NOTE — Progress Notes (Signed)
HPI: Erin Stone is a 35 y.o. female who  has a past medical history of PCOS (polycystic ovarian syndrome).  she presents to [facility] today, 09/30/20,  for chief complaint of: Annual physical exam  Dentist: Every 6 months, no concern Eye exam: 2-3 years ago, no correction, no concerns Exercise: Strength training twice weekly and walking twice weekly Diet: Previously doing very well with healthy choices and small portions but the last 6 weeks have been less than great due to stress at home Pap smear: 09/09/2020, normal COVID vaccine: Done, needs booster  Concerns: None  Past medical, surgical, social and family history reviewed:  Patient Active Problem List   Diagnosis Date Noted   History of cesarean section 02/01/2020   Suspected fetal anomaly, antepartum 02/22/2011   ACNE VULGARIS 09/08/2007   WEIGHT GAIN, ABNORMAL 09/08/2007    Past Surgical History:  Procedure Laterality Date   ANTERIOR CRUCIATE LIGAMENT REPAIR  2002   CESAREAN SECTION  05/21/2011   Procedure: CESAREAN SECTION;  Surgeon: Jonnie Kind, MD;  Location: [facility];  Service: Gynecology;  Laterality: N/A;  Primary Cesarean Section Delivery Baby Girl @ 0400, Apgars 9/9    Social History   Tobacco Use   Smoking status: Never   Smokeless tobacco: Never  Substance Use Topics   Alcohol use: Yes    Alcohol/week: 3.0 standard drinks    Types: 3 Glasses of wine per week    Comment: 1-2 times weekly    Family History  Problem Relation Age of Onset   Cervical polyp Mother    Diabetes Father    Prostate cancer Father    Anesthesia problems Neg Hx      Current medication list and allergy/intolerance information reviewed:    Current Outpatient Medications  Medication Sig Dispense Refill   Cyanocobalamin (B-12 PO) Take 1 tablet by mouth daily.     levonorgestrel (MIRENA) 20 MCG/DAY IUD 20 mcg by Intrauterine route once.     MELATONIN PO Take 1 capsule by mouth at bedtime  as needed.     metFORMIN (GLUCOPHAGE XR) 500 MG 24 hr tablet Take 1 tablet (500 mg total) by mouth daily with breakfast. 90 tablet 3   Multiple Vitamin (MULTIVITAMIN) tablet Take 1 tablet by mouth daily.     Nutritional Supplements (VITAMIN D BOOSTER PO) Take 1 tablet by mouth daily.     Omega-3 Fatty Acids (FISH OIL BURP-LESS PO) Take 1 capsule by mouth daily.     No current facility-administered medications for this visit.    No Known Allergies    Review of Systems: Constitutional:  No  fever, no chills, No recent illness, No unintentional weight changes. No significant fatigue.  HEENT: No  headache, no vision change, no hearing change, No sore throat, No  sinus pressure Cardiac: No  chest pain, No  pressure, No palpitations, No  Orthopnea Respiratory:  No  shortness of breath. No  Cough Gastrointestinal: No  abdominal pain, No  nausea, No  vomiting,  No  blood in stool, No  diarrhea, No  constipation  Musculoskeletal: No new myalgia/arthralgia Skin: No  Rash, No other wounds/concerning lesions Genitourinary: No  incontinence, No  abnormal genital bleeding, No abnormal genital discharge Hem/Onc: No  easy bruising/bleeding, No  abnormal lymph node Endocrine: No cold intolerance,  No heat intolerance. No polyuria/polydipsia/polyphagia  Neurologic: No  weakness, No  dizziness, No  slurred speech/focal weakness/facial droop Psychiatric: No  concerns with depression, No  concerns with  anxiety, No sleep problems, No mood problems  Exam:  BP 129/84 (BP Location: Left Arm, Patient Position: Sitting, Cuff Size: Large)   Pulse 65   Resp 20   Ht 5' 4"  (1.626 m)   Wt 194 lb (88 kg)   SpO2 99%   BMI 33.30 kg/m  Constitutional: VS see above. General Appearance: alert, well-developed, well-nourished, NAD Eyes: Normal lids and conjunctive, non-icteric sclera Ears, Nose, Mouth, Throat: MMM, Normal external inspection ears/nares/mouth/lips/gums. TM normal bilaterally.  Neck: No masses, trachea  midline. No thyroid enlargement. No tenderness/mass appreciated. No lymphadenopathy Respiratory: Normal respiratory effort. no wheeze, no rhonchi, no rales Cardiovascular: S1/S2 normal, no murmur, no rub/gallop auscultated. RRR. No lower extremity edema. Pedal pulse II/IV bilaterally DP and PT. No carotid bruit or JVD. No abdominal aortic bruit. Gastrointestinal: Nontender, no masses. No hepatomegaly, no splenomegaly. No hernia appreciated. Bowel sounds normal. Rectal exam deferred.  Musculoskeletal: Gait normal. No clubbing/cyanosis of digits.  Neurological: Normal balance/coordination. No tremor. No cranial nerve deficit on limited exam. Motor and sensation intact and symmetric. Cerebellar reflexes intact.  Skin: warm, dry, intact. No rash/ulcer. No concerning nevi or subq nodules on limited exam.   Psychiatric: Normal judgment/insight. Normal mood and affect. Oriented x3.    ASSESSMENT/PLAN:   1. Annual physical exam Checking CBC with differential, CMP, lipid panel today. - CBC with Differential/Platelet - COMPLETE METABOLIC PANEL WITH GFR - Lipid panel  2. Thyroid disorder screen Checking TSH. - TSH  3. Diabetes mellitus screening Checking hemoglobin A1c. - Hemoglobin A1c  4. Flu vaccine need Flu vaccine given in office today. - Flu Vaccine QUAD 35moIM (Fluarix, Fluzone & Alfiuria Quad PF)  5. Need for hepatitis C screening test Discuss screening recommendations.  Patient agreeable so adding to blood work today. - Hepatitis C Antibody   Orders Placed This Encounter  Procedures   Flu Vaccine QUAD 640moM (Fluarix, Fluzone & Alfiuria Quad PF)   CBC with Differential/Platelet   COMPLETE METABOLIC PANEL WITH GFR   Lipid panel   TSH   Hemoglobin A1c   Hepatitis C Antibody   HM PAP SMEAR    No orders of the defined types were placed in this encounter.   Patient Instructions  Preventive Care 42 Old, Female Preventive care refers to lifestyle choices and visits  with your health care provider that can promote health and wellness. This includes: A yearly physical exam. This is also called an annual wellness visit. Regular dental and eye exams. Immunizations. Screening for certain conditions. Healthy lifestyle choices, such as: Eating a healthy diet. Getting regular exercise. Not using drugs or products that contain nicotine and tobacco. Limiting alcohol use. What can I expect for my preventive care visit? Physical exam Your health care provider may check your: Height and weight. These may be used to calculate your BMI (body mass index). BMI is a measurement that tells if you are at a healthy weight. Heart rate and blood pressure. Body temperature. Skin for abnormal spots. Counseling Your health care provider may ask you questions about your: Past medical problems. Family's medical history. Alcohol, tobacco, and drug use. Emotional well-being. Home life and relationship well-being. Sexual activity. Diet, exercise, and sleep habits. Work and work enStatisticianAccess to firearms. Method of birth control. Menstrual cycle. Pregnancy history. What immunizations do I need? Vaccines are usually given at various ages, according to a schedule. Your health care provider will recommend vaccines for you based on your age, medical history, and lifestyle or other factors, such as  travel or where you work. What tests do I need? Blood tests Lipid and cholesterol levels. These may be checked every 5 years starting at age 60. Hepatitis C test. Hepatitis B test. Screening Diabetes screening. This is done by checking your blood sugar (glucose) after you have not eaten for a while (fasting). STD (sexually transmitted disease) testing, if you are at risk. BRCA-related cancer screening. This may be done if you have a family history of breast, ovarian, tubal, or peritoneal cancers. Pelvic exam and Pap test. This may be done every 3 years starting at age 72.  Starting at age 42, this may be done every 5 years if you have a Pap test in combination with an HPV test. Talk with your health care provider about your test results, treatment options, and if necessary, the need for more tests. Follow these instructions at home: Eating and drinking  Eat a healthy diet that includes fresh fruits and vegetables, whole grains, lean protein, and low-fat dairy products. Take vitamin and mineral supplements as recommended by your health care provider. Do not drink alcohol if: Your health care provider tells you not to drink. You are pregnant, may be pregnant, or are planning to become pregnant. If you drink alcohol: Limit how much you have to 0-1 drink a day. Be aware of how much alcohol is in your drink. In the [place] one drink equals one 12 oz bottle of beer (355 mL), one 5 oz glass of wine (148 mL), or one 1 oz glass of hard liquor (44 mL). Lifestyle Take daily care of your teeth and gums. Brush your teeth every morning and night with fluoride toothpaste. Floss one time each day. Stay active. Exercise for at least 30 minutes 5 or more days each week. Do not use any products that contain nicotine or tobacco, such as cigarettes, e-cigarettes, and chewing tobacco. If you need help quitting, ask your health care provider. Do not use drugs. If you are sexually active, practice safe sex. Use a condom or other form of protection to prevent STIs (sexually transmitted infections). If you do not wish to become pregnant, use a form of birth control. If you plan to become pregnant, see your health care provider for a prepregnancy visit. Find healthy ways to cope with stress, such as: Meditation, yoga, or listening to music. Journaling. Talking to a trusted person. Spending time with friends and family. Safety Always wear your seat belt while driving or riding in a vehicle. Do not drive: If you have been drinking alcohol. Do not ride with someone who has been  drinking. When you are tired or distracted. While texting. Wear a helmet and other protective equipment during sports activities. If you have firearms in your house, make sure you follow all gun safety procedures. Seek help if you have been physically or sexually abused. What's next? Go to your health care provider once a year for an annual wellness visit. Ask your health care provider how often you should have your eyes and teeth checked. Stay up to date on all vaccines. This information is not intended to replace advice given to you by your health care provider. Make sure you discuss any questions you have with your health care provider. Document Revised: 03/15/2020 Document Reviewed: 09/16/2017 Elsevier Patient Education  2022 [employer].   Follow-up plan: Return in about 1 year (around 09/30/2021) for annual physical exam.  Clearnce Sorrel, DNP, APRN, FNP-BC [facility]

## 2020-09-30 NOTE — Patient Instructions (Signed)
Preventive Care 21-34 Years Old, Female Preventive care refers to lifestyle choices and visits with your health care provider that can promote health and wellness. This includes: A yearly physical exam. This is also called an annual wellness visit. Regular dental and eye exams. Immunizations. Screening for certain conditions. Healthy lifestyle choices, such as: Eating a healthy diet. Getting regular exercise. Not using drugs or products that contain nicotine and tobacco. Limiting alcohol use. What can I expect for my preventive care visit? Physical exam Your health care provider may check your: Height and weight. These may be used to calculate your BMI (body mass index). BMI is a measurement that tells if you are at a healthy weight. Heart rate and blood pressure. Body temperature. Skin for abnormal spots. Counseling Your health care provider may ask you questions about your: Past medical problems. Family's medical history. Alcohol, tobacco, and drug use. Emotional well-being. Home life and relationship well-being. Sexual activity. Diet, exercise, and sleep habits. Work and work environment. Access to firearms. Method of birth control. Menstrual cycle. Pregnancy history. What immunizations do I need? Vaccines are usually given at various ages, according to a schedule. Your health care provider will recommend vaccines for you based on your age, medical history, and lifestyle or other factors, such as travel or where you work. What tests do I need? Blood tests Lipid and cholesterol levels. These may be checked every 5 years starting at age 20. Hepatitis C test. Hepatitis B test. Screening Diabetes screening. This is done by checking your blood sugar (glucose) after you have not eaten for a while (fasting). STD (sexually transmitted disease) testing, if you are at risk. BRCA-related cancer screening. This may be done if you have a family history of breast, ovarian, tubal, or  peritoneal cancers. Pelvic exam and Pap test. This may be done every 3 years starting at age 21. Starting at age 30, this may be done every 5 years if you have a Pap test in combination with an HPV test. Talk with your health care provider about your test results, treatment options, and if necessary, the need for more tests. Follow these instructions at home: Eating and drinking  Eat a healthy diet that includes fresh fruits and vegetables, whole grains, lean protein, and low-fat dairy products. Take vitamin and mineral supplements as recommended by your health care provider. Do not drink alcohol if: Your health care provider tells you not to drink. You are pregnant, may be pregnant, or are planning to become pregnant. If you drink alcohol: Limit how much you have to 0-1 drink a day. Be aware of how much alcohol is in your drink. In the [place] one drink equals one 12 oz bottle of beer (355 mL), one 5 oz glass of wine (148 mL), or one 1 oz glass of hard liquor (44 mL). Lifestyle Take daily care of your teeth and gums. Brush your teeth every morning and night with fluoride toothpaste. Floss one time each day. Stay active. Exercise for at least 30 minutes 5 or more days each week. Do not use any products that contain nicotine or tobacco, such as cigarettes, e-cigarettes, and chewing tobacco. If you need help quitting, ask your health care provider. Do not use drugs. If you are sexually active, practice safe sex. Use a condom or other form of protection to prevent STIs (sexually transmitted infections). If you do not wish to become pregnant, use a form of birth control. If you plan to become pregnant, see your health care provider   for a prepregnancy visit. Find healthy ways to cope with stress, such as: Meditation, yoga, or listening to music. Journaling. Talking to a trusted person. Spending time with friends and family. Safety Always wear your seat belt while driving or riding in a  vehicle. Do not drive: If you have been drinking alcohol. Do not ride with someone who has been drinking. When you are tired or distracted. While texting. Wear a helmet and other protective equipment during sports activities. If you have firearms in your house, make sure you follow all gun safety procedures. Seek help if you have been physically or sexually abused. What's next? Go to your health care provider once a year for an annual wellness visit. Ask your health care provider how often you should have your eyes and teeth checked. Stay up to date on all vaccines. This information is not intended to replace advice given to you by your health care provider. Make sure you discuss any questions you have with your health care provider. Document Revised: 03/15/2020 Document Reviewed: 09/16/2017 Elsevier Patient Education  2022 [employer]  

## 2020-10-02 LAB — COMPLETE METABOLIC PANEL WITH GFR
AG Ratio: 1.7 (calc) (ref 1.0–2.5)
ALT: 21 U/L (ref 6–29)
AST: 20 U/L (ref 10–30)
Albumin: 4.9 g/dL (ref 3.6–5.1)
Alkaline phosphatase (APISO): 58 U/L (ref 31–125)
BUN: 17 mg/dL (ref 7–25)
CO2: 29 mmol/L (ref 20–32)
Calcium: 10 mg/dL (ref 8.6–10.2)
Chloride: 102 mmol/L (ref 98–110)
Creat: 0.89 mg/dL (ref 0.50–0.97)
Globulin: 2.9 g/dL (calc) (ref 1.9–3.7)
Glucose, Bld: 97 mg/dL (ref 65–99)
Potassium: 4.4 mmol/L (ref 3.5–5.3)
Sodium: 140 mmol/L (ref 135–146)
Total Bilirubin: 0.8 mg/dL (ref 0.2–1.2)
Total Protein: 7.8 g/dL (ref 6.1–8.1)
eGFR: 87 mL/min/{1.73_m2} (ref >=60)

## 2020-10-02 LAB — CBC WITH DIFFERENTIAL/PLATELET
Absolute Monocytes: 515 cells/uL (ref 200–950)
Basophils Absolute: 58 cells/uL (ref 0–200)
Basophils Relative: 0.7 %
Eosinophils Absolute: 33 cells/uL (ref 15–500)
Eosinophils Relative: 0.4 %
HCT: 48.3 % — ABNORMAL HIGH (ref 35.0–45.0)
Hemoglobin: 16.3 g/dL — ABNORMAL HIGH (ref 11.7–15.5)
Lymphs Abs: 1602 cells/uL (ref 850–3900)
MCH: 30.9 pg (ref 27.0–33.0)
MCHC: 33.7 g/dL (ref 32.0–36.0)
MCV: 91.5 fL (ref 80.0–100.0)
MPV: 10.4 fL (ref 7.5–12.5)
Monocytes Relative: 6.2 %
Neutro Abs: 6092 cells/uL (ref 1500–7800)
Neutrophils Relative %: 73.4 %
Platelets: 261 10*3/uL (ref 140–400)
RBC: 5.28 10*6/uL — ABNORMAL HIGH (ref 3.80–5.10)
RDW: 12.4 % (ref 11.0–15.0)
Total Lymphocyte: 19.3 %
WBC: 8.3 10*3/uL (ref 3.8–10.8)

## 2020-10-02 LAB — LIPID PANEL
Cholesterol: 197 mg/dL (ref <200)
HDL: 64 mg/dL (ref >=50)
LDL Cholesterol (Calc): 112 mg/dL (calc) — ABNORMAL HIGH
Non-HDL Cholesterol (Calc): 133 mg/dL (calc) — ABNORMAL HIGH (ref <130)
Total CHOL/HDL Ratio: 3.1 (calc) (ref <5.0)
Triglycerides: 105 mg/dL (ref <150)

## 2020-10-02 LAB — TSH: TSH: 2.96 mIU/L

## 2020-10-02 LAB — HEPATITIS C ANTIBODY
Hepatitis C Ab: NONREACTIVE
SIGNAL TO CUT-OFF: 0.01 (ref <1.00)

## 2020-10-02 LAB — HEMOGLOBIN A1C
Hgb A1c MFr Bld: 5.1 % of total Hgb (ref <5.7)
Mean Plasma Glucose: 100 mg/dL
eAG (mmol/L): 5.5 mmol/L

## 2020-10-09 ENCOUNTER — Encounter: Payer: Self-pay | Admitting: Physician Assistant

## 2020-10-09 ENCOUNTER — Telehealth: Payer: Commercial Managed Care - PPO | Admitting: Physician Assistant

## 2020-10-09 DIAGNOSIS — U071 COVID-19: Secondary | ICD-10-CM | POA: Diagnosis not present

## 2020-10-09 MED ORDER — PSEUDOEPHEDRINE HCL 30 MG PO TABS: 30.0000 mg | ORAL_TABLET | ORAL | 0 refills | Status: DC | PRN

## 2020-10-09 NOTE — Patient Instructions (Signed)
Erin Stone, thank you for joining [name], PA-C for today's virtual visit.  While this provider is not your primary care provider (PCP), if your PCP is located in our provider database this encounter information will be shared with them immediately following your visit.  Consent: (Patient) Erin Stone provided verbal consent for this virtual visit at the beginning of the encounter.  Current Medications:  Current Outpatient Medications:    Cyanocobalamin (B-12 PO), Take 1 tablet by mouth daily., Disp: , Rfl:    levonorgestrel (MIRENA) 20 MCG/DAY IUD, 20 mcg by Intrauterine route once., Disp: , Rfl:    MELATONIN PO, Take 1 capsule by mouth at bedtime as needed., Disp: , Rfl:    metFORMIN (GLUCOPHAGE XR) 500 MG 24 hr tablet, Take 1 tablet (500 mg total) by mouth daily with breakfast., Disp: 90 tablet, Rfl: 3   Multiple Vitamin (MULTIVITAMIN) tablet, Take 1 tablet by mouth daily., Disp: , Rfl:    Nutritional Supplements (VITAMIN D BOOSTER PO), Take 1 tablet by mouth daily., Disp: , Rfl:    Omega-3 Fatty Acids (FISH OIL BURP-LESS PO), Take 1 capsule by mouth daily., Disp: , Rfl:    Medications ordered in this encounter:  No orders of the defined types were placed in this encounter.    *If you need refills on other medications prior to your next appointment, please contact your pharmacy*  Follow-Up: Call back or seek an in-person evaluation if the symptoms worsen or if the condition fails to improve as anticipated.  Other Instructions Please keep well-hydrated and get plenty of rest. Start a saline nasal rinse to flush out your nasal passages. You can use plain Mucinex to help thin congestion. If you have a humidifier, running in the bedroom at night. I want you to start OTC vitamin D3 1000 units daily, vitamin C 1000 mg daily, and a zinc supplement. Please take prescribed medications as directed.  You have been enrolled in a MyChart symptom monitoring program. Please  answer these questions daily so we can keep track of how you are doing.  You were to quarantine for 5 days from onset of your symptoms.  After day 5, if you have had no fever and you are feeling better, you can end quarantine but need to mask for an additional 5 days. After day 5 if you have a fever or are having significant symptoms, please quarantine for full 10 days.  If you note any worsening of symptoms, any significant shortness of breath or any chest pain, please seek ER evaluation ASAP.  Please do not delay care!  COVID-19: What to Do if You Are Sick If you test positive and are an older adult or someone who is at high risk of getting very sick from COVID-19, treatment may be available. Contact a healthcare provider right away after a positive test to determine if you are eligible, even if your symptoms are mild right now. You can also visit a Test to Treat location and, if eligible, receive a prescription from a provider. Don't delay: Treatment must be started within the first few days to be effective. If you have a fever, cough, or other symptoms, you might have COVID-19. Most people have mild illness and are able to recover at home. If you are sick: Keep track of your symptoms. If you have an emergency warning sign (including trouble breathing), call 911. Steps to help prevent the spread of COVID-19 if you are sick If you are sick with COVID-19  or think you might have COVID-19, follow the steps below to care for yourself and to help protect other people in your home and community. Stay home except to get medical care Stay home. Most people with COVID-19 have mild illness and can recover at home without medical care. Do not leave your home, except to get medical care. Do not visit public areas and do not go to places where you are unable to wear a mask. Take care of yourself. Get rest and stay hydrated. Take over-the-counter medicines, such as acetaminophen, to help you feel better. Stay  in touch with your doctor. Call before you get medical care. Be sure to get care if you have trouble breathing, or have any other emergency warning signs, or if you think it is an emergency. Avoid public transportation, ride-sharing, or taxis if possible. Get tested If you have symptoms of COVID-19, get tested. While waiting for test results, stay away from others, including staying apart from those living in your household. Get tested as soon as possible after your symptoms start. Treatments may be available for people with COVID-19 who are at risk for becoming very sick. Don't delay: Treatment must be started early to be effective--some treatments must begin within 5 days of your first symptoms. Contact your healthcare provider right away if your test result is positive to determine if you are eligible. Self-tests are one of several options for testing for the virus that causes COVID-19 and may be more convenient than laboratory-based tests and point-of-care tests. Ask your healthcare provider or your local health department if you need help interpreting your test results. You can visit your state, tribal, local, and territorial health department's website to look for the latest local information on testing sites. Separate yourself from other people As much as possible, stay in a specific room and away from other people and pets in your home. If possible, you should use a separate bathroom. If you need to be around other people or animals in or outside of the home, wear a well-fitting mask. Tell your close contacts that they may have been exposed to COVID-19. An infected person can spread COVID-19 starting 48 hours (or 2 days) before the person has any symptoms or tests positive. By letting your close contacts know they may have been exposed to COVID-19, you are helping to protect everyone. See COVID-19 and Animals if you have questions about pets. If you are diagnosed with COVID-19, someone from the  health department may call you. Answer the call to slow the spread. Monitor your symptoms Symptoms of COVID-19 include fever, cough, or other symptoms. Follow care instructions from your healthcare provider and local health department. Your local health authorities may give instructions on checking your symptoms and reporting information. When to seek emergency medical attention Look for emergency warning signs* for COVID-19. If someone is showing any of these signs, seek emergency medical care immediately: Trouble breathing Persistent pain or pressure in the chest New confusion Inability to wake or stay awake Pale, [place], or blue-colored skin, lips, or nail beds, depending on skin tone *This list is not all possible symptoms. Please call your medical provider for any other symptoms that are severe or concerning to you. Call 911 or call ahead to your local emergency facility: Notify the operator that you are seeking care for someone who has or may have COVID-19. Call ahead before visiting your doctor Call ahead. Many medical visits for routine care are being postponed or done by phone or  telemedicine. If you have a medical appointment that cannot be postponed, call your doctor's office, and tell them you have or may have COVID-19. This will help the office protect themselves and other patients. If you are sick, wear a well-fitting mask You should wear a mask if you must be around other people or animals, including pets (even at home). Wear a mask with the best fit, protection, and comfort for you. You don't need to wear the mask if you are alone. If you can't put on a mask (because of trouble breathing, for example), cover your coughs and sneezes in some other way. Try to stay at least 6 feet away from other people. This will help protect the people around you. Masks should not be placed on young children under age 4 years, anyone who has trouble breathing, or anyone who is not able to remove the  mask without help. Cover your coughs and sneezes Cover your mouth and nose with a tissue when you cough or sneeze. Throw away used tissues in a lined trash can. Immediately wash your hands with soap and water for at least 20 seconds. If soap and water are not available, clean your hands with an alcohol-based hand sanitizer that contains at least 60% alcohol. Clean your hands often Wash your hands often with soap and water for at least 20 seconds. This is especially important after blowing your nose, coughing, or sneezing; going to the bathroom; and before eating or preparing food. Use hand sanitizer if soap and water are not available. Use an alcohol-based hand sanitizer with at least 60% alcohol, covering all surfaces of your hands and rubbing them together until they feel dry. Soap and water are the best option, especially if hands are visibly dirty. Avoid touching your eyes, nose, and mouth with unwashed hands. Handwashing Tips Avoid sharing personal household items Do not share dishes, drinking glasses, cups, eating utensils, towels, or bedding with other people in your home. Wash these items thoroughly after using them with soap and water or put in the dishwasher. Clean surfaces in your home regularly Clean and disinfect high-touch surfaces (for example, doorknobs, tables, handles, light switches, and countertops) in your "sick room" and bathroom. In shared spaces, you should clean and disinfect surfaces and items after each use by the person who is ill. If you are sick and cannot clean, a caregiver or other person should only clean and disinfect the area around you (such as your bedroom and bathroom) on an as needed basis. Your caregiver/other person should wait as long as possible (at least several hours) and wear a mask before entering, cleaning, and disinfecting shared spaces that you use. Clean and disinfect areas that may have blood, stool, or body fluids on them. Use household cleaners  and disinfectants. Clean visible dirty surfaces with household cleaners containing soap or detergent. Then, use a household disinfectant. Use a product from [employer] List N: Disinfectants for Coronavirus (COVID-19). Be sure to follow the instructions on the label to ensure safe and effective use of the product. Many products recommend keeping the surface wet with a disinfectant for a certain period of time (look at "contact time" on the product label). You may also need to wear personal protective equipment, such as gloves, depending on the directions on the product label. Immediately after disinfecting, wash your hands with soap and water for 20 seconds. For completed guidance on cleaning and disinfecting your home, visit Complete Disinfection Guidance. Take steps to improve ventilation at home Improve  ventilation (air flow) at home to help prevent from spreading COVID-19 to other people in your household. Clear out COVID-19 virus particles in the air by opening windows, using air filters, and turning on fans in your home. Use this interactive tool to learn how to improve air flow in your home. When you can be around others after being sick with COVID-19 Deciding when you can be around others is different for different situations. Find out when you can safely end home isolation. For any additional questions about your care, contact your healthcare provider or state or local health department. 04/09/2020 Content source: [facility] for Immunization and Respiratory Diseases ([facility]), Division of Viral Diseases This information is not intended to replace advice given to you by your health care provider. Make sure you discuss any questions you have with your health care provider. Document Revised: 05/23/2020 Document Reviewed: 05/23/2020 Elsevier Patient Education  2022 [employer].   If you have been instructed to have an in-person evaluation today at a local Urgent Care facility, please use the  link below. It will take you to a list of all of our available [facility], including address, phone number and hours of operation. Please do not delay care.  [facility]  If you or a family member do not have a primary care provider, use the link below to schedule a visit and establish care. When you choose a [place] primary care physician or advanced practice provider, you gain a long-term partner in health. Find a Primary Care Provider  Learn more about [facility]'s in-office and virtual care options: [facility] - Get Care Now

## 2020-10-09 NOTE — Progress Notes (Signed)
Virtual Visit Consent   [name], you are scheduled for a virtual visit with a [facility] provider today.     Just as with appointments in the office, your consent must be obtained to participate.  Your consent will be active for this visit and any virtual visit you may have with one of our providers in the next 365 days.     If you have a MyChart account, a copy of this consent can be sent to you electronically.  All virtual visits are billed to your insurance company just like a traditional visit in the office.    As this is a virtual visit, video technology does not allow for your provider to perform a traditional examination.  This may limit your provider's ability to fully assess your condition.  If your provider identifies any concerns that need to be evaluated in person or the need to arrange testing (such as labs, EKG, etc.), we will make arrangements to do so.     Although advances in technology are sophisticated, we cannot ensure that it will always work on either your end or our end.  If the connection with a video visit is poor, the visit may have to be switched to a telephone visit.  With either a video or telephone visit, we are not always able to ensure that we have a secure connection.     I need to obtain your verbal consent now.   Are you willing to proceed with your visit today?    [name] has provided verbal consent on 10/09/2020 for a virtual visit (video or telephone).   Erin Stone, [place]   Date: 10/09/2020 8:14 AM   Virtual Visit via Video Note   I, Erin Stone, connected with  FINLEY DINKEL  ([id number], 06/10/1985) on 10/09/20 at  8:00 AM EDT by a video-enabled telemedicine application and verified that I am speaking with the correct person using two identifiers.  Location: Patient: Virtual Visit Location Patient: Home Provider: Virtual Visit Location Provider: Home Office   I discussed the limitations of evaluation and management by  telemedicine and the availability of in person appointments. The patient expressed understanding and agreed to proceed.    History of Present Illness: Erin Stone is a 35 y.o. who identifies as a female who was assigned female at birth, and is being seen today for COVID-19. Notes symptoms starting Monday with headache continuing into Tuesday with development of head congestion. Denies significant chest congestion. Has had a cough this morning. Denies chest pressure or SOB. Denies recent travel. Had a positive test as of yesterday.  Acetaminophen for headache without much help. Nasal spray for congestion.   HPI: HPI  Problems:  Patient Active Problem List   Diagnosis Date Noted   History of cesarean section 02/01/2020   Suspected fetal anomaly, antepartum 02/22/2011   ACNE VULGARIS 09/08/2007   WEIGHT GAIN, ABNORMAL 09/08/2007    Allergies: No Known Allergies Medications:  Current Outpatient Medications:    pseudoephedrine (SUDAFED SINUS CONGESTION) 30 MG tablet, Take 1 tablet (30 mg total) by mouth every 4 (four) hours as needed for congestion., Disp: 30 tablet, Rfl: 0   Cyanocobalamin (B-12 PO), Take 1 tablet by mouth daily., Disp: , Rfl:    levonorgestrel (MIRENA) 20 MCG/DAY IUD, 20 mcg by Intrauterine route once., Disp: , Rfl:    MELATONIN PO, Take 1 capsule by mouth at bedtime as needed., Disp: , Rfl:    metFORMIN (GLUCOPHAGE  XR) 500 MG 24 hr tablet, Take 1 tablet (500 mg total) by mouth daily with breakfast., Disp: 90 tablet, Rfl: 3   Multiple Vitamin (MULTIVITAMIN) tablet, Take 1 tablet by mouth daily., Disp: , Rfl:    Nutritional Supplements (VITAMIN D BOOSTER PO), Take 1 tablet by mouth daily., Disp: , Rfl:    Omega-3 Fatty Acids (FISH OIL BURP-LESS PO), Take 1 capsule by mouth daily., Disp: , Rfl:   Observations/Objective: Patient is well-developed, well-nourished in no acute distress.  Resting comfortably at home.  Head is normocephalic, atraumatic.  No labored  breathing. Speech is clear and coherent with logical content.  Patient is alert and oriented at baseline.   Assessment and Plan: 1. COVID-19 - pseudoephedrine (SUDAFED SINUS CONGESTION) 30 MG tablet; Take 1 tablet (30 mg total) by mouth every 4 (four) hours as needed for congestion.  Dispense: 30 tablet; Refill: 0 - MyChart COVID-19 home monitoring program; Future Milder symptoms. Lower risk of complications - risk score of 1. No indication for antiviral at present time as risk of ADR from medication > likely benefit. Supportive measures, OTC medications and Vitamin regimen reviewed. Patient enrolled in COVID monitoring program through MyChart. Strict ER precautions reviewed.   Follow Up Instructions: I discussed the assessment and treatment plan with the patient. The patient was provided an opportunity to ask questions and all were answered. The patient agreed with the plan and demonstrated an understanding of the instructions.  A copy of instructions were sent to the patient via MyChart.  The patient was advised to call back or seek an in-person evaluation if the symptoms worsen or if the condition fails to improve as anticipated.  Time:  I spent 12 minutes with the patient via telehealth technology discussing the above problems/concerns.    Erin Climes, PA-C

## 2020-11-14 ENCOUNTER — Telehealth: Payer: Self-pay

## 2020-11-14 NOTE — Telephone Encounter (Signed)
[employer] form faxed. Confirmation received.

## 2021-09-29 NOTE — Progress Notes (Unsigned)
Complete physical exam  [name]   DOB: Dec 31, 1985   36 y.o. Female  MRN: [id number]  Subjective:    No chief complaint on file.  SINEAD HOCKMAN is a 36 y.o. female who presents today for a complete physical exam. She reports consuming a {diet types:17450} diet. {types:19826} She generally feels {DESC; WELL/FAIRLY WELL/POORLY:18703}. She reports sleeping {DESC; WELL/FAIRLY WELL/POORLY:18703}. She {does/does not:200015} have additional problems to discuss today.    Most recent fall risk assessment:    09/30/2020    2:55 PM  [place] in the past year? 0  Number falls in past yr: 0  Injury with Fall? 0  Risk for fall due to : No Fall Risks  Follow up Falls evaluation completed     Most recent depression screenings:    09/30/2020    2:54 PM 09/13/2019   11:15 AM  PHQ 2/9 Scores  PHQ - 2 Score 2 0  PHQ- 9 Score 10 0    {VISON DENTAL STD PSA (Optional):27386}  {History (Optional):23778}  Patient Care Team: Samuel Bouche, NP as PCP - General (Nurse Practitioner)   Outpatient Medications Prior to Visit  Medication Sig   Cyanocobalamin (B-12 PO) Take 1 tablet by mouth daily.   levonorgestrel (MIRENA) 20 MCG/DAY IUD 20 mcg by Intrauterine route once.   MELATONIN PO Take 1 capsule by mouth at bedtime as needed.   metFORMIN (GLUCOPHAGE XR) 500 MG 24 hr tablet Take 1 tablet (500 mg total) by mouth daily with breakfast.   Multiple Vitamin (MULTIVITAMIN) tablet Take 1 tablet by mouth daily.   Nutritional Supplements (VITAMIN D BOOSTER PO) Take 1 tablet by mouth daily.   Omega-3 Fatty Acids (FISH OIL BURP-LESS PO) Take 1 capsule by mouth daily.   pseudoephedrine (SUDAFED SINUS CONGESTION) 30 MG tablet Take 1 tablet (30 mg total) by mouth every 4 (four) hours as needed for congestion.   No facility-administered medications prior to visit.    ROS        Objective:     There were no vitals taken for this visit. {Vitals History (Optional):23777}  Physical  Exam   No results found for any visits on 09/30/21. {Show previous labs (optional):23779}    Assessment & Plan:    Routine Health Maintenance and Physical Exam  Immunization History  Administered Date(s) Administered   Hepatitis B 08/20/1998   Hpv-Unspecified 08/13/2006   Influenza Split 11/10/2010   Influenza Whole 10/24/2008, 10/19/2009   Influenza,inj,Quad PF,6+ Mos 09/13/2019, 09/30/2020   Influenza-Unspecified 10/19/2012, 10/28/2015, 11/05/2016, 10/28/2017   MMR 02/09/2013   PFIZER(Purple Top)SARS-COV-2 Vaccination 03/10/2019, 03/31/2019   Td 03/07/2010   Tdap 05/20/2011, 11/16/2012    Health Maintenance  Topic Date Due   HPV VACCINES (2 - 3-dose series) 09/10/2006   COVID-19 Vaccine (3 - Pfizer series) 05/26/2019   INFLUENZA VACCINE  08/19/2021   TETANUS/TDAP  11/17/2022   PAP SMEAR-Modifier  09/10/2023   Hepatitis C Screening  Completed   HIV Screening  Completed    Discussed health benefits of physical activity, and encouraged her to engage in regular exercise appropriate for her age and condition.  Problem List Items Addressed This Visit   None Visit Diagnoses     Annual physical exam    -  Primary   Need for influenza vaccination       Need for HPV vaccination          No follow-ups on file.     Samuel Bouche, NP

## 2021-09-30 ENCOUNTER — Ambulatory Visit (INDEPENDENT_AMBULATORY_CARE_PROVIDER_SITE_OTHER): Payer: Commercial Managed Care - PPO | Admitting: Medical-Surgical

## 2021-09-30 ENCOUNTER — Encounter: Payer: Self-pay | Admitting: Medical-Surgical

## 2021-09-30 VITALS — BP 123/83 | HR 84 | Resp 20 | Ht 64.0 in | Wt 216.6 lb

## 2021-09-30 DIAGNOSIS — Z1329 Encounter for screening for other suspected endocrine disorder: Secondary | ICD-10-CM

## 2021-09-30 DIAGNOSIS — Z0000 Encounter for general adult medical examination without abnormal findings: Secondary | ICD-10-CM

## 2021-09-30 DIAGNOSIS — Z131 Encounter for screening for diabetes mellitus: Secondary | ICD-10-CM

## 2021-09-30 DIAGNOSIS — N200 Calculus of kidney: Secondary | ICD-10-CM | POA: Insufficient documentation

## 2021-09-30 DIAGNOSIS — M549 Dorsalgia, unspecified: Secondary | ICD-10-CM | POA: Insufficient documentation

## 2021-09-30 DIAGNOSIS — Z23 Encounter for immunization: Secondary | ICD-10-CM | POA: Diagnosis not present

## 2021-09-30 DIAGNOSIS — O1490 Unspecified pre-eclampsia, unspecified trimester: Secondary | ICD-10-CM

## 2021-09-30 HISTORY — DX: Calculus of kidney: N20.0

## 2021-09-30 HISTORY — DX: Unspecified pre-eclampsia, unspecified trimester: O14.90

## 2021-10-02 LAB — CBC WITH DIFFERENTIAL/PLATELET
Absolute Monocytes: 521 cells/uL (ref 200–950)
Basophils Absolute: 37 cells/uL (ref 0–200)
Basophils Relative: 0.6 %
Eosinophils Absolute: 99 cells/uL (ref 15–500)
Eosinophils Relative: 1.6 %
HCT: 44.2 % (ref 35.0–45.0)
Hemoglobin: 15.1 g/dL (ref 11.7–15.5)
Lymphs Abs: 2207 cells/uL (ref 850–3900)
MCH: 31.1 pg (ref 27.0–33.0)
MCHC: 34.2 g/dL (ref 32.0–36.0)
MCV: 91.1 fL (ref 80.0–100.0)
MPV: 10.4 fL (ref 7.5–12.5)
Monocytes Relative: 8.4 %
Neutro Abs: 3336 cells/uL (ref 1500–7800)
Neutrophils Relative %: 53.8 %
Platelets: 240 10*3/uL (ref 140–400)
RBC: 4.85 10*6/uL (ref 3.80–5.10)
RDW: 12.1 % (ref 11.0–15.0)
Total Lymphocyte: 35.6 %
WBC: 6.2 10*3/uL (ref 3.8–10.8)

## 2021-10-02 LAB — COMPLETE METABOLIC PANEL WITH GFR
AG Ratio: 1.5 (calc) (ref 1.0–2.5)
ALT: 23 U/L (ref 6–29)
AST: 19 U/L (ref 10–30)
Albumin: 4.3 g/dL (ref 3.6–5.1)
Alkaline phosphatase (APISO): 54 U/L (ref 31–125)
BUN: 13 mg/dL (ref 7–25)
CO2: 28 mmol/L (ref 20–32)
Calcium: 9.3 mg/dL (ref 8.6–10.2)
Chloride: 103 mmol/L (ref 98–110)
Creat: 0.86 mg/dL (ref 0.50–0.97)
Globulin: 2.9 g/dL (calc) (ref 1.9–3.7)
Glucose, Bld: 98 mg/dL (ref 65–99)
Potassium: 4.5 mmol/L (ref 3.5–5.3)
Sodium: 139 mmol/L (ref 135–146)
Total Bilirubin: 0.6 mg/dL (ref 0.2–1.2)
Total Protein: 7.2 g/dL (ref 6.1–8.1)
eGFR: 90 mL/min/{1.73_m2} (ref >=60)

## 2021-10-02 LAB — LIPID PANEL
Cholesterol: 189 mg/dL (ref <200)
HDL: 63 mg/dL (ref >=50)
LDL Cholesterol (Calc): 106 mg/dL (calc) — ABNORMAL HIGH
Non-HDL Cholesterol (Calc): 126 mg/dL (calc) (ref <130)
Total CHOL/HDL Ratio: 3 (calc) (ref <5.0)
Triglycerides: 108 mg/dL (ref <150)

## 2021-10-02 LAB — TSH: TSH: 1.9 mIU/L

## 2021-10-02 LAB — HEMOGLOBIN A1C
Hgb A1c MFr Bld: 5.2 % of total Hgb (ref <5.7)
Mean Plasma Glucose: 103 mg/dL
eAG (mmol/L): 5.7 mmol/L

## 2022-10-13 ENCOUNTER — Ambulatory Visit (INDEPENDENT_AMBULATORY_CARE_PROVIDER_SITE_OTHER): Payer: BC Managed Care – PPO | Admitting: Medical-Surgical

## 2022-10-13 ENCOUNTER — Encounter: Payer: Self-pay | Admitting: Medical-Surgical

## 2022-10-13 VITALS — BP 125/78 | HR 89 | Resp 20 | Ht 64.0 in | Wt 221.1 lb

## 2022-10-13 DIAGNOSIS — R7402 Elevation of levels of lactic acid dehydrogenase (LDH): Secondary | ICD-10-CM | POA: Diagnosis not present

## 2022-10-13 DIAGNOSIS — Z0000 Encounter for general adult medical examination without abnormal findings: Secondary | ICD-10-CM | POA: Diagnosis not present

## 2022-10-13 DIAGNOSIS — Z23 Encounter for immunization: Secondary | ICD-10-CM

## 2022-10-13 DIAGNOSIS — R4584 Anhedonia: Secondary | ICD-10-CM | POA: Diagnosis not present

## 2022-10-13 MED ORDER — BUPROPION HCL ER (XL) 150 MG PO TB24: 150.0000 mg | ORAL_TABLET | Freq: Every day | ORAL | 1 refills | Status: DC

## 2022-10-13 NOTE — Progress Notes (Signed)
Complete physical exam  [name]   DOB: 09-08-1985   37 y.o. Female  MRN: [id number]  Subjective:    Chief Complaint  Patient presents with   Annual Exam   Erin Stone is a 37 y.o. female who presents today for a complete physical exam. She reports consuming a general diet.  HIIT classes twice weekly plus 30 minutes of walking three times weekly.  She generally feels well. She reports sleeping well. She does not have additional problems to discuss today.    Most recent fall risk assessment:    10/13/2022    2:21 PM  Fall Risk   Falls in the past year? 0  Number falls in past yr: 0  Injury with Fall? 0  Risk for fall due to : No Fall Risks  Follow up Falls evaluation completed     Most recent depression screenings:    10/13/2022    2:21 PM 09/30/2021    4:06 PM  PHQ 2/9 Scores  PHQ - 2 Score 0 0    Vision:Within last year, Dental: No current dental problems and Receives regular dental care, and STD: The patient denies history of sexually transmitted disease.    Patient Care Team: Christen Butter, NP as PCP - General (Nurse Practitioner)   Outpatient Medications Prior to Visit  Medication Sig   levonorgestrel (MIRENA) 20 MCG/DAY IUD 20 mcg by Intrauterine route once.   Multiple Vitamin (MULTIVITAMIN) tablet Take 1 tablet by mouth daily.   [DISCONTINUED] Cyanocobalamin (B-12 PO) Take 1 tablet by mouth daily.   [DISCONTINUED] MELATONIN PO Take 1 capsule by mouth at bedtime as needed.   [DISCONTINUED] metFORMIN (GLUCOPHAGE XR) 500 MG 24 hr tablet Take 1 tablet (500 mg total) by mouth daily with breakfast.   [DISCONTINUED] Nutritional Supplements (VITAMIN D BOOSTER PO) Take 1 tablet by mouth daily.   [DISCONTINUED] Omega-3 Fatty Acids (FISH OIL BURP-LESS PO) Take 1 capsule by mouth daily.   No facility-administered medications prior to visit.    Review of Systems  Constitutional:  Negative for chills, fever, malaise/fatigue and weight loss.  HENT:  Negative  for congestion, ear pain, hearing loss, sinus pain and sore throat.   Eyes:  Negative for blurred vision, photophobia and pain.  Respiratory:  Negative for cough, shortness of breath and wheezing.   Cardiovascular:  Negative for chest pain, palpitations and leg swelling.  Gastrointestinal:  Negative for abdominal pain, constipation, diarrhea, heartburn, nausea and vomiting.  Genitourinary:  Negative for dysuria, frequency and urgency.  Musculoskeletal:  Negative for falls and neck pain.  Skin:  Negative for itching and rash.  Neurological:  Negative for dizziness, weakness and headaches.  Endo/Heme/Allergies:  Negative for polydipsia. Does not bruise/bleed easily.  Psychiatric/Behavioral:  Positive for depression. Negative for substance abuse and suicidal ideas. The patient is nervous/anxious. The patient does not have insomnia.      Objective:    BP 125/78 (BP Location: Left Arm, Cuff Size: Normal)   Pulse 89   Resp 20   Ht 5\' 4"  (1.626 m)   Wt 221 lb 1.6 oz (100.3 kg)   SpO2 99%   BMI 37.95 kg/m    Physical Exam Vitals reviewed.  Constitutional:      General: She is not in acute distress.    Appearance: Normal appearance. She is obese. She is not ill-appearing.  HENT:     Head: Normocephalic and atraumatic.     Right Ear: Tympanic membrane, ear canal and external ear  normal. There is no impacted cerumen.     Left Ear: Tympanic membrane, ear canal and external ear normal. There is no impacted cerumen.     Nose: Nose normal. No congestion or rhinorrhea.     Mouth/Throat:     Mouth: Mucous membranes are moist.     Pharynx: No oropharyngeal exudate or posterior oropharyngeal erythema.  Eyes:     General: No scleral icterus.       Right eye: No discharge.        Left eye: No discharge.     Extraocular Movements: Extraocular movements intact.     Conjunctiva/sclera: Conjunctivae normal.     Pupils: Pupils are equal, round, and reactive to light.  Neck:     Thyroid: No  thyromegaly.     Vascular: No carotid bruit or JVD.     Trachea: Trachea normal.  Cardiovascular:     Rate and Rhythm: Normal rate and regular rhythm.     Pulses: Normal pulses.     Heart sounds: Normal heart sounds. No murmur heard.    No friction rub. No gallop.  Pulmonary:     Effort: Pulmonary effort is normal. No respiratory distress.     Breath sounds: Normal breath sounds. No wheezing.  Abdominal:     General: Bowel sounds are normal. There is no distension.     Palpations: Abdomen is soft.     Tenderness: There is no abdominal tenderness. There is no guarding.  Musculoskeletal:        General: Normal range of motion.     Cervical back: Normal range of motion and neck supple.  Lymphadenopathy:     Cervical: No cervical adenopathy.  Skin:    General: Skin is warm and dry.  Neurological:     Mental Status: She is alert and oriented to person, place, and time.     Cranial Nerves: No cranial nerve deficit.  Psychiatric:        Mood and Affect: Mood normal.        Behavior: Behavior normal.        Thought Content: Thought content normal.        Judgment: Judgment normal.      No results found for any visits on 10/13/22.     Assessment & Plan:    Routine Health Maintenance and Physical Exam  Immunization History  Administered Date(s) Administered   Hepatitis B 08/20/1998   Hpv-Unspecified 08/13/2006   Influenza Split 11/10/2010   Influenza Whole 10/24/2008, 10/19/2009   Influenza,inj,Quad PF,6+ Mos 09/13/2019, 09/30/2020, 09/30/2021   Influenza-Unspecified 10/19/2012, 10/28/2015, 11/05/2016, 10/28/2017   MMR 02/09/2013   PFIZER(Purple Top)SARS-COV-2 Vaccination 03/10/2019, 03/31/2019   Td 03/07/2010   Tdap 05/20/2011, 11/16/2012    Health Maintenance  Topic Date Due   INFLUENZA VACCINE  08/20/2022   DTaP/Tdap/Td (4 - Td or Tdap) 11/17/2022   Cervical Cancer Screening (HPV/Pap Cotest)  09/10/2023   Hepatitis C Screening  Completed   HIV Screening  Completed    HPV VACCINES  Discontinued   COVID-19 Vaccine  Discontinued    Discussed health benefits of physical activity, and encouraged her to engage in regular exercise appropriate for her age and condition.  1. Annual physical exam Checking labs as below. UTD on preventative care. Wellness information provided with AVS. - CBC with Differential/Platelet - CMP14+EGFR  2. Elevated LDH Checking lipids today. - Lipid panel  3. Need for influenza vaccination Flu vaccine given in office.  4. Anhedonia Discussed various symptoms.  She  does seem to have more depressive symptoms than anxiety.  Consider possible seasonal affective disorder as well.  She is already doing some counseling with [facility].  Discussed various options for management.  Starting Wellbutrin 150 mg daily to help with mood but may also benefit with weight loss efforts.  Return in about 4 weeks (around 11/10/2022) for mood follow up.   Christen Butter, NP

## 2022-10-14 LAB — CBC WITH DIFFERENTIAL/PLATELET
Basophils Absolute: 0.1 10*3/uL (ref 0.0–0.2)
Basos: 1 %
EOS (ABSOLUTE): 0.1 10*3/uL (ref 0.0–0.4)
Eos: 1 %
Hematocrit: 47.1 % — ABNORMAL HIGH (ref 34.0–46.6)
Hemoglobin: 15.7 g/dL (ref 11.1–15.9)
Immature Grans (Abs): 0 10*3/uL (ref 0.0–0.1)
Immature Granulocytes: 0 %
Lymphocytes Absolute: 2.1 10*3/uL (ref 0.7–3.1)
Lymphs: 27 %
MCH: 31 pg (ref 26.6–33.0)
MCHC: 33.3 g/dL (ref 31.5–35.7)
MCV: 93 fL (ref 79–97)
Monocytes Absolute: 0.7 10*3/uL (ref 0.1–0.9)
Monocytes: 10 %
Neutrophils Absolute: 4.8 10*3/uL (ref 1.4–7.0)
Neutrophils: 61 %
Platelets: 299 10*3/uL (ref 150–450)
RBC: 5.07 x10E6/uL (ref 3.77–5.28)
RDW: 12 % (ref 11.7–15.4)
WBC: 7.7 10*3/uL (ref 3.4–10.8)

## 2022-10-14 LAB — CMP14+EGFR
ALT: 20 IU/L (ref 0–32)
AST: 22 IU/L (ref 0–40)
Albumin: 4.8 g/dL (ref 3.9–4.9)
Alkaline Phosphatase: 71 IU/L (ref 44–121)
BUN/Creatinine Ratio: 13 (ref 9–23)
BUN: 11 mg/dL (ref 6–20)
Bilirubin Total: 0.4 mg/dL (ref 0.0–1.2)
CO2: 22 mmol/L (ref 20–29)
Calcium: 9.7 mg/dL (ref 8.7–10.2)
Chloride: 101 mmol/L (ref 96–106)
Creatinine, Ser: 0.88 mg/dL (ref 0.57–1.00)
Globulin, Total: 2.6 g/dL (ref 1.5–4.5)
Glucose: 92 mg/dL (ref 70–99)
Potassium: 4.1 mmol/L (ref 3.5–5.2)
Sodium: 139 mmol/L (ref 134–144)
Total Protein: 7.4 g/dL (ref 6.0–8.5)
eGFR: 87 mL/min/{1.73_m2} (ref >59)

## 2022-10-14 LAB — LIPID PANEL
Chol/HDL Ratio: 3.5 ratio (ref 0.0–4.4)
Cholesterol, Total: 184 mg/dL (ref 100–199)
HDL: 52 mg/dL (ref >39)
LDL Chol Calc (NIH): 112 mg/dL — ABNORMAL HIGH (ref 0–99)
Triglycerides: 110 mg/dL (ref 0–149)
VLDL Cholesterol Cal: 20 mg/dL (ref 5–40)

## 2022-11-10 ENCOUNTER — Ambulatory Visit (INDEPENDENT_AMBULATORY_CARE_PROVIDER_SITE_OTHER): Payer: BC Managed Care – PPO | Admitting: Medical-Surgical

## 2022-11-10 ENCOUNTER — Encounter: Payer: Self-pay | Admitting: Medical-Surgical

## 2022-11-10 VITALS — BP 114/78 | HR 77 | Resp 20 | Ht 64.0 in | Wt 218.5 lb

## 2022-11-10 DIAGNOSIS — R4584 Anhedonia: Secondary | ICD-10-CM | POA: Diagnosis not present

## 2022-11-10 NOTE — Progress Notes (Signed)
        Established patient visit  History, exam, impression, and plan:  1. Anhedonia Pleasant 37 year old female presenting today to follow-up on mood concerns.  She started Wellbutrin 150 mg daily approximately 4 weeks ago and has been tolerating this well without significant side effects.  Initially noted a boost in energy and mood however this did decrease a bit over the first couple of weeks.  Feels that medication is working well for her and she is feeling much better.  Unsure if she would benefit from an increased dose but is interested in trying to go up to 300 mg daily.  Advised that she is welcome to take 2 of the 150 mg tablets together in the morning or she can take 150 mg twice daily to evaluate her response to the medication.  If she likes this at the higher dose or the split dosing, advised her to contact me via MyChart in 3-4 weeks and I can certainly resend her prescription with the new dose/instructions.  Denies SI/HI.  Mood, affect, speech pattern, thoughts, and cognition all normal during the appointment.  Procedures performed this visit: None.  Return for Mood follow-up in 3-4 weeks via MyChart message only.  __________________________________ Thayer Ohm, DNP, APRN, FNP-BC Primary Care and Sports Medicine [facility] [place]

## 2022-11-20 ENCOUNTER — Encounter: Payer: Self-pay | Admitting: Medical-Surgical

## 2022-11-20 MED ORDER — BUPROPION HCL ER (XL) 300 MG PO TB24: 300.0000 mg | ORAL_TABLET | Freq: Every day | ORAL | 3 refills | Status: DC

## 2023-09-23 DIAGNOSIS — F418 Other specified anxiety disorders: Secondary | ICD-10-CM | POA: Insufficient documentation

## 2023-10-07 ENCOUNTER — Other Ambulatory Visit: Payer: Self-pay | Admitting: Medical Genetics

## 2023-10-14 ENCOUNTER — Ambulatory Visit (INDEPENDENT_AMBULATORY_CARE_PROVIDER_SITE_OTHER): Admitting: Medical-Surgical

## 2023-10-14 ENCOUNTER — Encounter: Payer: Self-pay | Admitting: Medical-Surgical

## 2023-10-14 VITALS — BP 126/83 | HR 85 | Resp 20 | Ht 64.0 in | Wt 231.0 lb

## 2023-10-14 DIAGNOSIS — R7402 Elevation of levels of lactic acid dehydrogenase (LDH): Secondary | ICD-10-CM

## 2023-10-14 DIAGNOSIS — E66812 Obesity, class 2: Secondary | ICD-10-CM

## 2023-10-14 DIAGNOSIS — E7800 Pure hypercholesterolemia, unspecified: Secondary | ICD-10-CM | POA: Diagnosis not present

## 2023-10-14 DIAGNOSIS — E6609 Other obesity due to excess calories: Secondary | ICD-10-CM

## 2023-10-14 DIAGNOSIS — Z23 Encounter for immunization: Secondary | ICD-10-CM

## 2023-10-14 DIAGNOSIS — Z0000 Encounter for general adult medical examination without abnormal findings: Secondary | ICD-10-CM | POA: Diagnosis not present

## 2023-10-14 DIAGNOSIS — R0683 Snoring: Secondary | ICD-10-CM | POA: Insufficient documentation

## 2023-10-14 DIAGNOSIS — F418 Other specified anxiety disorders: Secondary | ICD-10-CM

## 2023-10-14 DIAGNOSIS — Z6839 Body mass index (BMI) 39.0-39.9, adult: Secondary | ICD-10-CM

## 2023-10-14 MED ORDER — BUPROPION HCL ER (XL) 300 MG PO TB24: 300.0000 mg | ORAL_TABLET | Freq: Every day | ORAL | 3 refills | Status: AC

## 2023-10-14 NOTE — Progress Notes (Signed)
 Complete physical exam  [name]   DOB: 11/19/1985   38 y.o. Female  MRN: [id number]  Subjective:    Chief Complaint  Patient presents with   Annual Exam    Erin Stone is a 38 y.o. female who presents today for a complete physical exam. She reports consuming a general diet. Gym/ health club routine includes walking 2-3 miles 3 times weekly and weight training 45 min twice weekly. She generally feels well. She reports sleeping well. She does not have additional problems to discuss today.    Most recent fall risk assessment:    10/14/2023    8:19 AM  Fall Risk   Falls in the past year? 1  Number falls in past yr: 0  Injury with Fall? 1  Risk for fall due to : History of fall(s)  Follow up Falls evaluation completed     Most recent depression screenings:    10/14/2023    8:19 AM 11/10/2022    9:28 AM  PHQ 2/9 Scores  PHQ - 2 Score 0 2  PHQ- 9 Score 0 4    Vision:Within last year and Dental: No current dental problems and Receives regular dental care    Patient Care Team: Willo Mini, NP as PCP - General (Nurse Practitioner)   Outpatient Medications Prior to Visit  Medication Sig   levonorgestrel (MIRENA) 20 MCG/DAY IUD 20 mcg by Intrauterine route once.   Multiple Vitamin (MULTIVITAMIN) tablet Take 1 tablet by mouth daily.   [DISCONTINUED] buPROPion  (WELLBUTRIN  XL) 300 MG 24 hr tablet Take 1 tablet (300 mg total) by mouth daily.   No facility-administered medications prior to visit.    Review of Systems  Constitutional:  Negative for chills, fever, malaise/fatigue and weight loss.  HENT:  Negative for congestion, ear pain, hearing loss, sinus pain and sore throat.   Eyes:  Negative for blurred vision, photophobia and pain.  Respiratory:  Negative for cough, shortness of breath and wheezing.   Cardiovascular:  Negative for chest pain, palpitations and leg swelling.  Gastrointestinal:  Negative for abdominal pain, constipation, diarrhea, heartburn,  nausea and vomiting.  Genitourinary:  Negative for dysuria, frequency and urgency.  Musculoskeletal:  Negative for falls and neck pain.  Skin:  Negative for itching and rash.  Neurological:  Negative for dizziness, weakness and headaches.  Endo/Heme/Allergies:  Negative for polydipsia. Does not bruise/bleed easily.  Psychiatric/Behavioral:  Negative for depression, substance abuse and suicidal ideas. The patient is not nervous/anxious.      Objective:     BP 126/83 (BP Location: Left Arm, Cuff Size: Normal)   Pulse 85   Resp 20   Ht 5' 4 (1.626 m)   Wt 231 lb (104.8 kg)   SpO2 98%   BMI 39.65 kg/m    Physical Exam Constitutional:      General: She is not in acute distress.    Appearance: Normal appearance. She is obese. She is not ill-appearing.  HENT:     Head: Normocephalic and atraumatic.     Right Ear: Tympanic membrane, ear canal and external ear normal. There is no impacted cerumen.     Left Ear: Tympanic membrane, ear canal and external ear normal. There is no impacted cerumen.     Nose: Nose normal. No congestion or rhinorrhea.     Mouth/Throat:     Mouth: Mucous membranes are moist.     Pharynx: No oropharyngeal exudate or posterior oropharyngeal erythema.  Eyes:  General: No scleral icterus.       Right eye: No discharge.        Left eye: No discharge.     Extraocular Movements: Extraocular movements intact.     Conjunctiva/sclera: Conjunctivae normal.     Pupils: Pupils are equal, round, and reactive to light.  Neck:     Thyroid: No thyromegaly.     Vascular: No carotid bruit or JVD.     Trachea: Trachea normal.  Cardiovascular:     Rate and Rhythm: Normal rate and regular rhythm.     Pulses: Normal pulses.     Heart sounds: Normal heart sounds. No murmur heard.    No friction rub. No gallop.  Pulmonary:     Effort: Pulmonary effort is normal. No respiratory distress.     Breath sounds: Normal breath sounds. No wheezing.  Abdominal:     General:  Bowel sounds are normal. There is no distension.     Palpations: Abdomen is soft.     Tenderness: There is no abdominal tenderness. There is no guarding.  Musculoskeletal:        General: Normal range of motion.     Cervical back: Normal range of motion and neck supple.  Lymphadenopathy:     Cervical: No cervical adenopathy.  Skin:    General: Skin is warm and dry.  Neurological:     Mental Status: She is alert and oriented to person, place, and time.     Cranial Nerves: No cranial nerve deficit.  Psychiatric:        Mood and Affect: Mood normal.        Behavior: Behavior normal.        Thought Content: Thought content normal.        Judgment: Judgment normal.      No results found for any visits on 10/14/23.     Assessment & Plan:    Routine Health Maintenance and Physical Exam  Immunization History  Administered Date(s) Administered   Hepatitis B 08/20/1998   Hpv-Unspecified 08/13/2006   Influenza Split 11/10/2010   Influenza Whole 10/24/2008, 10/19/2009   Influenza, Seasonal, Injecte, Preservative Fre 10/13/2022   Influenza,inj,Quad PF,6+ Mos 09/13/2019, 09/30/2020, 09/30/2021   Influenza-Unspecified 10/19/2012, 10/28/2015, 11/05/2016, 10/28/2017   MMR 02/09/2013   PFIZER(Purple Top)SARS-COV-2 Vaccination 03/10/2019, 03/31/2019   Td 03/07/2010   Tdap 05/20/2011, 11/16/2012    Health Maintenance  Topic Date Due   Hepatitis B Vaccines 19-59 Average Risk (2 of 3 - 3-dose series) 09/17/1998   DTaP/Tdap/Td (4 - Td or Tdap) 11/17/2022   Influenza Vaccine  08/20/2023   Cervical Cancer Screening (HPV/Pap Cotest)  09/10/2023   Hepatitis C Screening  Completed   HIV Screening  Completed   Pneumococcal Vaccine  Aged Out   Meningococcal B Vaccine  Aged Out   HPV VACCINES  Discontinued   COVID-19 Vaccine  Discontinued    Discussed health benefits of physical activity, and encouraged her to engage in regular exercise appropriate for her age and condition.  1. Annual  physical exam (Primary) Checking labs as below. UTD on preventative care. Wellness information provided with AVS. - CBC with Differential/Platelet - CMP14+EGFR  2. Class 2 obesity due to excess calories without serious comorbidity with body mass index (BMI) of 39.0 to 39.9 in adult Despite regular intentional exercise and dietary changes, her weight is stagnant and she is quite frustrated. Checking additional blood work to evaluate for a metabolic cause.  - Lipid panel - TSH - Hemoglobin A1c -  Cortisol  3. Elevated LDL cholesterol level Checking lipids.  - Lipid panel  4. Loud snoring Home sleep study ordered.  - Home sleep test; Future  5. Mixed anxiety and depressive disorder Doing well with stable mood. Continue Wellbutrin  XL 300mg  daily.   6. Need for influenza vaccination Flu vaccine given in office today.  - Flu vaccine trivalent PF, 6mos and older(Flulaval,Afluria,Fluarix,Fluzone)  7. Need for tetanus booster Tdap given in office today.   Return in about 1 year (around 10/13/2024) for annual physical exam or sooner if needed.     Arlan Birks, NP

## 2023-10-14 NOTE — Patient Instructions (Signed)

## 2023-10-15 ENCOUNTER — Ambulatory Visit: Payer: Self-pay | Admitting: Medical-Surgical

## 2023-10-15 LAB — LIPID PANEL
Chol/HDL Ratio: 4.4 ratio (ref 0.0–4.4)
Cholesterol, Total: 183 mg/dL (ref 100–199)
HDL: 42 mg/dL (ref >39)
LDL Chol Calc (NIH): 125 mg/dL — ABNORMAL HIGH (ref 0–99)
Triglycerides: 85 mg/dL (ref 0–149)
VLDL Cholesterol Cal: 16 mg/dL (ref 5–40)

## 2023-10-15 LAB — CBC WITH DIFFERENTIAL/PLATELET
Basophils Absolute: 0.1 x10E3/uL (ref 0.0–0.2)
Basos: 1 %
EOS (ABSOLUTE): 0.1 x10E3/uL (ref 0.0–0.4)
Eos: 1 %
Hematocrit: 45.9 % (ref 34.0–46.6)
Hemoglobin: 14.6 g/dL (ref 11.1–15.9)
Immature Grans (Abs): 0 x10E3/uL (ref 0.0–0.1)
Immature Granulocytes: 0 %
Lymphocytes Absolute: 1.7 x10E3/uL (ref 0.7–3.1)
Lymphs: 28 %
MCH: 29.6 pg (ref 26.6–33.0)
MCHC: 31.8 g/dL (ref 31.5–35.7)
MCV: 93 fL (ref 79–97)
Monocytes Absolute: 0.5 x10E3/uL (ref 0.1–0.9)
Monocytes: 8 %
Neutrophils Absolute: 3.8 x10E3/uL (ref 1.4–7.0)
Neutrophils: 62 %
Platelets: 289 x10E3/uL (ref 150–450)
RBC: 4.94 x10E6/uL (ref 3.77–5.28)
RDW: 12.9 % (ref 11.7–15.4)
WBC: 6 x10E3/uL (ref 3.4–10.8)

## 2023-10-15 LAB — CMP14+EGFR
ALT: 21 IU/L (ref 0–32)
AST: 18 IU/L (ref 0–40)
Albumin: 4.4 g/dL (ref 3.9–4.9)
Alkaline Phosphatase: 83 IU/L (ref 41–116)
BUN/Creatinine Ratio: 13 (ref 9–23)
BUN: 12 mg/dL (ref 6–20)
Bilirubin Total: 0.5 mg/dL (ref 0.0–1.2)
CO2: 21 mmol/L (ref 20–29)
Calcium: 9.3 mg/dL (ref 8.7–10.2)
Chloride: 101 mmol/L (ref 96–106)
Creatinine, Ser: 0.96 mg/dL (ref 0.57–1.00)
Globulin, Total: 2.6 g/dL (ref 1.5–4.5)
Glucose: 94 mg/dL (ref 70–99)
Potassium: 4.4 mmol/L (ref 3.5–5.2)
Sodium: 137 mmol/L (ref 134–144)
Total Protein: 7 g/dL (ref 6.0–8.5)
eGFR: 78 mL/min/1.73 (ref >59)

## 2023-10-15 LAB — HEMOGLOBIN A1C
Est. average glucose Bld gHb Est-mCnc: 111 mg/dL
Hgb A1c MFr Bld: 5.5 % (ref 4.8–5.6)

## 2023-10-15 LAB — TSH: TSH: 1.88 u[IU]/mL (ref 0.450–4.500)

## 2023-10-15 LAB — CORTISOL: Cortisol: 6.1 ug/dL — ABNORMAL LOW (ref 6.2–19.4)

## 2023-10-28 ENCOUNTER — Other Ambulatory Visit

## 2023-10-28 DIAGNOSIS — Z006 Encounter for examination for normal comparison and control in clinical research program: Secondary | ICD-10-CM

## 2023-11-06 LAB — GENECONNECT MOLECULAR SCREEN: Genetic Analysis Overall Interpretation: NEGATIVE

## 2023-11-12 ENCOUNTER — Encounter: Payer: Self-pay | Admitting: Medical-Surgical

## 2023-11-12 DIAGNOSIS — G4733 Obstructive sleep apnea (adult) (pediatric): Secondary | ICD-10-CM

## 2023-11-16 MED ORDER — AMBULATORY NON FORMULARY MEDICATION: 0 refills | Status: AC

## 2024-01-21 ENCOUNTER — Encounter: Payer: Self-pay | Admitting: Medical-Surgical

## 2024-01-21 NOTE — Telephone Encounter (Signed)
 Faxed to [employer] at [phone]

## 2024-01-28 ENCOUNTER — Telehealth: Admitting: Medical-Surgical

## 2024-01-28 DIAGNOSIS — G4733 Obstructive sleep apnea (adult) (pediatric): Secondary | ICD-10-CM | POA: Diagnosis not present

## 2024-02-11 ENCOUNTER — Encounter: Payer: Self-pay | Admitting: Medical-Surgical

## 2024-02-11 NOTE — Progress Notes (Signed)
 Virtual Visit via Video Note  I connected with [name] on 02/11/24 at  3:00 PM EST by a video enabled telemedicine application and verified that I am speaking with the correct person using two identifiers.   I discussed the limitations of evaluation and management by telemedicine and the availability of in person appointments. The patient expressed understanding and agreed to proceed.  Patient location: home Provider locations: office  Subjective:    Discussed the use of AI scribe software for clinical note transcription with the patient, who gave verbal consent to proceed.  History of Present Illness   Erin Stone is a 39 year old female who presents for a follow-up regarding CPAP compliance for sleep apnea.  Obstructive sleep apnea symptoms and cpap compliance - Uses CPAP nightly for 6 to 8 hours - Marked improvement in sleep quality and morning alertness - No longer feels the need to hit the snooze button - Wakes feeling rested compared with before CPAP initiation - Tolerates the device through the night without issues  Cpap equipment and supplies - Uses [employer] CPAP with a nasal pillow mask, which is comfortable - Anticipates needing a replacement nasal piece soon - No other supply problems     Past medical history, Surgical history, Family history not pertinant except as noted below, Social history, Allergies, and medications have been entered into the medical record, reviewed, and corrections made.   Review of Systems: See HPI for pertinent positives and negatives.   Objective:    General: Speaking clearly in complete sentences without any shortness of breath.  Alert and oriented x3.  Normal judgment. No apparent acute distress.  Impression and Recommendations:    Obstructive sleep apnea Well-managed with CPAP therapy. Significant improvement in sleep quality and well-being. Compliance requirement met. - Obtained compliance report from [employer] using serial number  [id number]. - Ensure CPAP supplies are adequate, including nasal pillow mask. - Continue current CPAP therapy regimen.     I discussed the assessment and treatment plan with the patient. The patient was provided an opportunity to ask questions and all were answered. The patient agreed with the plan and demonstrated an understanding of the instructions.   The patient was advised to call back or seek an in-person evaluation if the symptoms worsen or if the condition fails to improve as anticipated.  Return if symptoms worsen or fail to improve.  Zada FREDRIK Palin, DNP, APRN, FNP-BC [facility] and Sports Medicine

## 2024-10-13 ENCOUNTER — Encounter: Admitting: Medical-Surgical
